# Patient Record
Sex: Male | Born: 1990 | Race: Black or African American | Hispanic: No | Marital: Single | Smoking: Current some day smoker
Health system: Southern US, Community
[De-identification: ages and names within clinical notes are randomized; demographics above are authoritative.]

## PROBLEM LIST (undated history)

## (undated) ENCOUNTER — Emergency Department (HOSPITAL_COMMUNITY): Admission: EM | Payer: Medicaid Other | Source: Home / Self Care

---

## 2010-08-16 ENCOUNTER — Emergency Department (HOSPITAL_BASED_OUTPATIENT_CLINIC_OR_DEPARTMENT_OTHER)
Admission: EM | Admit: 2010-08-16 | Discharge: 2010-08-16 | Disposition: A | Discharge status: Home / Self Care | Payer: Medicaid Other | Attending: Emergency Medicine | Admitting: Emergency Medicine

## 2010-08-16 DIAGNOSIS — M545 Low back pain, unspecified: Secondary | ICD-10-CM | POA: Insufficient documentation

## 2010-10-26 ENCOUNTER — Emergency Department (HOSPITAL_BASED_OUTPATIENT_CLINIC_OR_DEPARTMENT_OTHER)
Admission: EM | Admit: 2010-10-26 | Discharge: 2010-10-26 | Disposition: A | Discharge status: Home / Self Care | Payer: Medicaid Other | Attending: Emergency Medicine | Admitting: Emergency Medicine

## 2010-10-26 DIAGNOSIS — R369 Urethral discharge, unspecified: Secondary | ICD-10-CM | POA: Insufficient documentation

## 2010-10-26 DIAGNOSIS — N342 Other urethritis: Secondary | ICD-10-CM | POA: Insufficient documentation

## 2010-10-26 LAB — URINALYSIS, ROUTINE W REFLEX MICROSCOPIC
Bilirubin Urine: NEGATIVE
Glucose, UA: NEGATIVE mg/dL
Hgb urine dipstick: NEGATIVE
Ketones, ur: NEGATIVE mg/dL
Protein, ur: NEGATIVE mg/dL
Urobilinogen, UA: 1 mg/dL (ref 0.0–1.0)

## 2010-10-29 LAB — GC/CHLAMYDIA PROBE AMP, GENITAL
Chlamydia, DNA Probe: POSITIVE — AB
GC Probe Amp, Genital: NEGATIVE

## 2011-10-17 ENCOUNTER — Encounter (HOSPITAL_BASED_OUTPATIENT_CLINIC_OR_DEPARTMENT_OTHER): Payer: Self-pay | Admitting: *Deleted

## 2011-10-17 ENCOUNTER — Emergency Department (HOSPITAL_BASED_OUTPATIENT_CLINIC_OR_DEPARTMENT_OTHER)
Admission: EM | Admit: 2011-10-17 | Discharge: 2011-10-17 | Disposition: A | Discharge status: Home / Self Care | Payer: Medicaid Other | Attending: Emergency Medicine | Admitting: Emergency Medicine

## 2011-10-17 DIAGNOSIS — IMO0001 Reserved for inherently not codable concepts without codable children: Secondary | ICD-10-CM | POA: Insufficient documentation

## 2011-10-17 DIAGNOSIS — M7918 Myalgia, other site: Secondary | ICD-10-CM

## 2011-10-17 DIAGNOSIS — N509 Disorder of male genital organs, unspecified: Secondary | ICD-10-CM | POA: Insufficient documentation

## 2011-10-17 DIAGNOSIS — R369 Urethral discharge, unspecified: Secondary | ICD-10-CM | POA: Insufficient documentation

## 2011-10-17 DIAGNOSIS — R599 Enlarged lymph nodes, unspecified: Secondary | ICD-10-CM | POA: Insufficient documentation

## 2011-10-17 DIAGNOSIS — R300 Dysuria: Secondary | ICD-10-CM | POA: Insufficient documentation

## 2011-10-17 DIAGNOSIS — N342 Other urethritis: Secondary | ICD-10-CM

## 2011-10-17 DIAGNOSIS — M25519 Pain in unspecified shoulder: Secondary | ICD-10-CM | POA: Insufficient documentation

## 2011-10-17 LAB — URINALYSIS, ROUTINE W REFLEX MICROSCOPIC
Bilirubin Urine: NEGATIVE
Nitrite: NEGATIVE
Specific Gravity, Urine: 1.021 (ref 1.005–1.030)
Urobilinogen, UA: 1 mg/dL (ref 0.0–1.0)
pH: 7 (ref 5.0–8.0)

## 2011-10-17 LAB — URINE MICROSCOPIC-ADD ON

## 2011-10-17 MED ORDER — IBUPROFEN 800 MG PO TABS: 800.0000 mg | ORAL_TABLET | Freq: Three times a day (TID) | ORAL | Status: DC | PRN

## 2011-10-17 MED ORDER — IBUPROFEN 800 MG PO TABS
800.0000 mg | ORAL_TABLET | Freq: Once | ORAL | Status: AC
  Administered 2011-10-17: 800 mg via ORAL
  Filled 2011-10-17: qty 1

## 2011-10-17 MED ORDER — CEFTRIAXONE SODIUM 250 MG IJ SOLR
250.0000 mg | Freq: Once | INTRAMUSCULAR | Status: AC
  Administered 2011-10-17: 250 mg via INTRAMUSCULAR
  Filled 2011-10-17: qty 250

## 2011-10-17 MED ORDER — AZITHROMYCIN 250 MG PO TABS
1000.0000 mg | ORAL_TABLET | Freq: Once | ORAL | Status: AC
  Administered 2011-10-17: 1000 mg via ORAL
  Filled 2011-10-17: qty 4

## 2011-10-17 MED ORDER — AZITHROMYCIN 500 MG PO TABS: 1000.0000 mg | ORAL_TABLET | Freq: Once | ORAL | Status: AC

## 2011-10-17 MED ORDER — METRONIDAZOLE 500 MG PO TABS
2000.0000 mg | ORAL_TABLET | Freq: Once | ORAL | Status: AC
  Administered 2011-10-17: 2000 mg via ORAL
  Filled 2011-10-17: qty 4

## 2011-10-17 NOTE — ED Provider Notes (Signed)
History     CSN: [id number]  Arrival date & time 10/17/11  4098   First MD Initiated Contact with Patient 10/17/11 [phone]      Chief Complaint  Patient presents with  . SEXUALLY TRANSMITTED DISEASE    (Consider location/radiation/quality/duration/timing/severity/associated sxs/prior treatment) HPI The patient is a 21 year old male with a history of Chlamydia who presents today complaining of penile discharge over the past 4 days. This is whitish milky in character. He endorses some penile pain as well as dysuria. Patient has no history of urinary tract infections. He denies any testicular tenderness. He should says this is slightly different from his last sexual transmitted infection. He did have unprotected sex a month ago. He was told that his partner had bacterial vaginosis but he has not been tested since that time. Patient denies nausea, vomiting, abdominal pain, or fevers. He also complains of some left shoulder pain that is an 8/10. He cannot recall any injury. Patient has not taken any medications for this. This is worse with movement. He has no history of prior shoulder injury. There are no other associated or modifying factors. History reviewed. No pertinent past medical history.  History reviewed. No pertinent past surgical history.  History reviewed. No pertinent family history.  History  Substance Use Topics  . Smoking status: Current Some Day Smoker -- 0.1 packs/day for 1 years    Types: Cigarettes  . Smokeless tobacco: Never Used  . Alcohol Use: No      Review of Systems  Constitutional: Negative.   HENT: Negative.   Eyes: Negative.   Respiratory: Negative.   Cardiovascular: Negative.   Gastrointestinal: Negative.   Genitourinary: Positive for dysuria, discharge and penile pain.  Musculoskeletal:       See HPI  Skin: Negative.   Neurological: Negative.   Hematological: Negative.   Psychiatric/Behavioral: Negative.   All other systems reviewed and are  negative.    Allergies  Review of patient's allergies indicates no known allergies.  Home Medications  No current outpatient prescriptions on file.  BP 123/63  Pulse 64  Temp(Src) 98.2 F (36.8 C) (Oral)  Resp 16  SpO2 99%  Physical Exam  Nursing note and vitals reviewed. GEN: Well-developed, well-nourished male in no distress HEENT: Atraumatic, normocephalic.  EYES: PERRLA BL, no scleral icterus. NECK: Trachea midline, no meningismus CV: regular rate and rhythm. No murmurs, rubs, or gallops PULM: No respiratory distress.  No crackles, wheezes, or rales. GI: soft, non-tender. No guarding, rebound, or tenderness. + bowel sounds  GU: Bilateral inguinal lymphadenopathy noted. Patient is a circumcised male. No testicular tenderness or masses. Patient has some light amount of white discharge noted at the urethral meatus. Swab was collected. Neuro: cranial nerves 2-12 intact, no abnormalities of strength or sensation, A and O x 3 MSK: Patient moves all 4 extremities symmetrically, no deformity, edema, or injury noted Skin: No rashes petechiae, purpura, or jaundice Psych: no abnormality of mood   ED Course  Procedures (including critical care time)  Labs Reviewed  URINALYSIS, ROUTINE W REFLEX MICROSCOPIC - Abnormal; Notable for the following:    APPearance CLOUDY (*)    Leukocytes, UA MODERATE (*)    All other components within normal limits  URINE MICROSCOPIC-ADD ON  URINE CULTURE  GC/CHLAMYDIA PROBE AMP, GENITAL   No results found.   1. Urethritis   2. Musculoskeletal pain       MDM  Patient was evaluated by myself. Based on evaluation he did have urinalysis and urine culture  performed. He also had gonorrhea and Chlamydia swab. Patient was counseled on the practice of safe sex with use of condoms. Given his prior history as well as his symptoms he was treated with azithromycin, Rocephin, and Flagyl for possible gonorrhea, Chlamydia, and Trichomonas. Patient was told  that he would need to seek testing for HIV as he is at risk for this as well. Patient complained of left shoulder pain with no obvious injury and no significant physical exam findings. He was given 800 mg of ibuprofen for this.  Urinary tract infection was consistent with the urethra this but given patient's dysuria a urine culture was sent as well. Patient was discharged in good condition.        Cyndra Numbers, MD 10/17/11 [phone]

## 2011-10-17 NOTE — ED Provider Notes (Signed)
Patient called 2 hours following discharge. He reported that he had vomited. Patient was concerned about antibiotics not being functional. I called in a not a prescription for 1000 mg of azithromycin to the patient's pharmacy. Advised him to try and take this again in 24 hours. He was advised to take this on a full stomach.   Cyndra Numbers, MD 10/17/11 [phone]

## 2011-10-17 NOTE — Discharge Instructions (Signed)
You were tested for gonorrhea and chlamydia tonight.  You were treated for gonorrhea, chlamydia, and trichomonas.  Please notify all of your partners and use condoms. Musculoskeletal Pain Musculoskeletal pain is muscle and boney aches and pains. These pains can occur in any part of the body. Your caregiver may treat you without knowing the cause of the pain. They may treat you if blood or urine tests, X-rays, and other tests were normal.  CAUSES There is often not a definite cause or reason for these pains. These pains may be caused by a type of germ (virus). The discomfort may also come from overuse. Overuse includes working out too hard when your body is not fit. Boney aches also come from weather changes. Bone is sensitive to atmospheric pressure changes. HOME CARE INSTRUCTIONS   Ask when your test results will be ready. Make sure you get your test results.   Only take over-the-counter or prescription medicines for pain, discomfort, or fever as directed by your caregiver. If you were given medications for your condition, do not drive, operate machinery or power tools, or sign legal documents for 24 hours. Do not drink alcohol. Do not take sleeping pills or other medications that may interfere with treatment.   Continue all activities unless the activities cause more pain. When the pain lessens, slowly resume normal activities. Gradually increase the intensity and duration of the activities or exercise.   During periods of severe pain, bed rest may be helpful. Lay or sit in any position that is comfortable.   Putting ice on the injured area.   Put ice in a bag.   Place a towel between your skin and the bag.   Leave the ice on for 15 to 20 minutes, 3 to 4 times a day.   Follow up with your caregiver for continued problems and no reason can be found for the pain. If the pain becomes worse or does not go away, it may be necessary to repeat tests or do additional testing. Your caregiver may need  to look further for a possible cause.  SEEK IMMEDIATE MEDICAL CARE IF:  You have pain that is getting worse and is not relieved by medications.   You develop chest pain that is associated with shortness or breath, sweating, feeling sick to your stomach (nauseous), or throw up (vomit).   Your pain becomes localized to the abdomen.   You develop any new symptoms that seem different or that concern you.  MAKE SURE YOU:   Understand these instructions.   Will watch your condition.   Will get help right away if you are not doing well or get worse.  Document Released: 05/16/2005 Document Revised: 05/05/2011 Document Reviewed: 01/04/2008 [facility] Patient Information 2012 [place], [place].Urethritis, Adult Urethritis is an inflammation (soreness) of the urethra (the tube exiting from the bladder). It is often caused by germs that may be spread through sexual contact. TREATMENT  Urethritis will usually respond to antibiotics. These are medications that kill germs. Take all the medicine given to you. You may feel better in a couple days, but TAKE ALL MEDICINE or the infection may not be completely cured and may become more difficult to treat. Response can generally be expected in 7 to 10 days. You may require additional treatment after more testing. HOME CARE INSTRUCTIONS  Not have sex until the test results are known and treatment is completed.   Know that you may be asked to notify your sex partner when your final test results  are back.   Finish all medications as prescribed.   Prevent sexually transmitted infections including AIDS. Practice safe sex. Use condoms.  SEEK MEDICAL CARE IF:   Your symptoms are not improved in 2 to 3 days.   Your symptoms are getting worse.   Your develop abdominal pain.   You develop joint pain.  SEEK IMMEDIATE MEDICAL CARE IF:   You have a fever.   You develop severe pain in the belly, back or side.   You develop repeated vomiting.  TEST  RESULTS Not all test results are available during your visit. If your test results are not back during the visit, make an appointment with your caregiver to find out the results. Do not assume everything is normal if you have not heard from your caregiver or the medical facility. It is important for you to follow-up on all of your test results. Document Released: 11/09/2000 Document Revised: 05/05/2011 Document Reviewed: 06/01/2009 [facility] Patient Information 2012 [place], [place].

## 2011-10-17 NOTE — ED Notes (Signed)
Pt reports penile discharge, cream and milky in color, penile pain since a week ago Thursday. States that pain has now radiated to his back and shoulder. Pt states that he was last sexually active a month ago. States that a sexual partner did notify pt a couple of weeks ago that she had Bacterial Vaginosa, but pt did not seek treatment. Pt reports back/shoulder pain 8/10. Pt reports not taking any OTC meds for the pain. Pt reports pain on urination, as well.

## 2011-10-18 LAB — GC/CHLAMYDIA PROBE AMP, GENITAL
Chlamydia, DNA Probe: NEGATIVE
GC Probe Amp, Genital: POSITIVE — AB

## 2011-10-18 LAB — URINE CULTURE
Colony Count: NO GROWTH
Culture: NO GROWTH

## 2011-10-19 ENCOUNTER — Encounter (HOSPITAL_BASED_OUTPATIENT_CLINIC_OR_DEPARTMENT_OTHER): Payer: Self-pay

## 2011-10-19 ENCOUNTER — Emergency Department (HOSPITAL_BASED_OUTPATIENT_CLINIC_OR_DEPARTMENT_OTHER)
Admission: EM | Admit: 2011-10-19 | Discharge: 2011-10-19 | Disposition: A | Discharge status: Home / Self Care | Payer: Medicaid Other | Attending: Emergency Medicine | Admitting: Emergency Medicine

## 2011-10-19 DIAGNOSIS — IMO0001 Reserved for inherently not codable concepts without codable children: Secondary | ICD-10-CM | POA: Insufficient documentation

## 2011-10-19 DIAGNOSIS — J3489 Other specified disorders of nose and nasal sinuses: Secondary | ICD-10-CM | POA: Insufficient documentation

## 2011-10-19 DIAGNOSIS — R509 Fever, unspecified: Secondary | ICD-10-CM | POA: Insufficient documentation

## 2011-10-19 DIAGNOSIS — B349 Viral infection, unspecified: Secondary | ICD-10-CM

## 2011-10-19 MED ORDER — DESLORATADINE 5 MG PO TABS: 5.0000 mg | ORAL_TABLET | Freq: Every day | ORAL | Status: DC

## 2011-10-19 MED ORDER — IBUPROFEN 600 MG PO TABS: 600.0000 mg | ORAL_TABLET | Freq: Four times a day (QID) | ORAL | Status: AC | PRN

## 2011-10-19 NOTE — ED Notes (Signed)
C/o fever, body aches since 630pm

## 2011-10-19 NOTE — ED Notes (Signed)
+   gonorrhea Patient treated with Rocephin  And Zithromax [facility]HS letter faxed.

## 2011-10-19 NOTE — ED Provider Notes (Signed)
History     CSN: [id number]  Arrival date & time 10/19/11  2126   First MD Initiated Contact with Patient 10/19/11 2301      Chief Complaint  Patient presents with  . Fever  . Generalized Body Aches    (Consider location/radiation/quality/duration/timing/severity/associated sxs/prior treatment) Patient is a 21 y.o. male presenting with fever. The history is provided by the patient. No language interpreter was used.  Fever Primary symptoms of the febrile illness include fever and myalgias. Primary symptoms do not include fatigue, visual change, headaches, cough, wheezing, shortness of breath, abdominal pain, nausea, vomiting, diarrhea, dysuria or rash. The current episode started today. This is a new problem. The problem has not changed since onset. Myalgias began today. The myalgias have been unchanged since their onset. The myalgias are generalized. The myalgias are aching. The discomfort from the myalgias is mild. The myalgias are not associated with weakness, tenderness or swelling. Risk factors: sick contact.  The onset of the illness is associated with recent antibiotic use. Risk factors: none.   History reviewed. No pertinent past medical history.  History reviewed. No pertinent past surgical history.  No family history on file.  History  Substance Use Topics  . Smoking status: Current Some Day Smoker -- 0.1 packs/day for 1 years    Types: Cigarettes  . Smokeless tobacco: Never Used  . Alcohol Use: No      Review of Systems  Constitutional: Positive for fever. Negative for fatigue.  HENT: Positive for congestion, rhinorrhea and sneezing. Negative for sore throat, trouble swallowing, neck pain, neck stiffness and voice change.   Respiratory: Negative for cough, shortness of breath and wheezing.   Cardiovascular: Negative for chest pain and leg swelling.  Gastrointestinal: Negative for nausea, vomiting, abdominal pain and diarrhea.  Genitourinary: Negative for  dysuria.  Musculoskeletal: Positive for myalgias.  Skin: Negative for rash.  Neurological: Negative for weakness and headaches.    Allergies  Review of patient's allergies indicates no known allergies.  Home Medications   Current Outpatient Rx  Name Route Sig Dispense Refill  . AZITHROMYCIN 500 MG PO TABS Oral Take 2 tablets (1,000 mg total) by mouth once. 2 tablet 0  . IBUPROFEN 800 MG PO TABS Oral Take 800 mg by mouth every 8 (eight) hours as needed. Patient used this medication for his headache.      BP 104/62  Pulse 77  Temp(Src) 98.2 F (36.8 C) (Oral)  Resp 16  Ht 6' (1.829 m)  Wt 149 lb (67.586 kg)  BMI 20.21 kg/m2  SpO2 100%  Physical Exam  Constitutional: He is oriented to person, place, and time. He appears well-developed and well-nourished.  HENT:  Head: Normocephalic and atraumatic.  Right Ear: Tympanic membrane is not injected.  Left Ear: Tympanic membrane is not injected.  Mouth/Throat: Oropharynx is clear and moist.  Eyes: Conjunctivae are normal. Pupils are equal, round, and reactive to light.  Neck: Normal range of motion. Neck supple.  Cardiovascular: Normal rate and regular rhythm.   Pulmonary/Chest: Effort normal and breath sounds normal. He has no wheezes. He has no rales.  Abdominal: Soft. Bowel sounds are normal. There is no tenderness. There is no rebound and no guarding.  Musculoskeletal: Normal range of motion.  Neurological: He is alert and oriented to person, place, and time.  Skin: Skin is warm and dry.  Psychiatric: He has a normal mood and affect.    ED Course  Procedures (including critical care time)  Labs Reviewed - No  data to display No results found.   No diagnosis found.    MDM  Return for fevers, > 101.4, cough productive change in voice or difficulty swallowing.  Mother with same.  Patient verbalizes understanding and agrees to follow up       Mellina Benison K Milee Qualls-Rasch, MD 10/19/11 2307

## 2011-10-21 NOTE — ED Notes (Signed)
Called and notified patient of +result and treatment. Verified x2.

## 2012-03-13 ENCOUNTER — Emergency Department (HOSPITAL_BASED_OUTPATIENT_CLINIC_OR_DEPARTMENT_OTHER)
Admission: EM | Admit: 2012-03-13 | Discharge: 2012-03-13 | Disposition: A | Discharge status: Home / Self Care | Payer: Medicaid Other | Attending: Emergency Medicine | Admitting: Emergency Medicine

## 2012-03-13 ENCOUNTER — Encounter (HOSPITAL_BASED_OUTPATIENT_CLINIC_OR_DEPARTMENT_OTHER): Payer: Self-pay | Admitting: *Deleted

## 2012-03-13 DIAGNOSIS — Z202 Contact with and (suspected) exposure to infections with a predominantly sexual mode of transmission: Secondary | ICD-10-CM | POA: Insufficient documentation

## 2012-03-13 DIAGNOSIS — F17200 Nicotine dependence, unspecified, uncomplicated: Secondary | ICD-10-CM | POA: Insufficient documentation

## 2012-03-13 MED ORDER — LIDOCAINE HCL (PF) 1 % IJ SOLN
INTRAMUSCULAR | Status: AC
  Administered 2012-03-13: 5 mL
  Filled 2012-03-13: qty 5

## 2012-03-13 MED ORDER — AZITHROMYCIN 250 MG PO TABS
1000.0000 mg | ORAL_TABLET | Freq: Once | ORAL | Status: AC
  Administered 2012-03-13: 1000 mg via ORAL
  Filled 2012-03-13: qty 4

## 2012-03-13 MED ORDER — AZITHROMYCIN 1 G PO PACK: 1.0000 g | PACK | Freq: Once | ORAL | Status: DC

## 2012-03-13 MED ORDER — CEFTRIAXONE SODIUM 250 MG IJ SOLR
250.0000 mg | Freq: Once | INTRAMUSCULAR | Status: AC
  Administered 2012-03-13: 250 mg via INTRAMUSCULAR
  Filled 2012-03-13: qty 250

## 2012-03-13 NOTE — ED Notes (Signed)
Yellow discharge from his penis x 2 days.

## 2012-03-13 NOTE — ED Provider Notes (Signed)
Medical screening examination/treatment/procedure(s) were performed by non-physician practitioner and as supervising physician I was immediately available for consultation/collaboration.   Hilari Wethington W Warrene Kapfer, MD 03/13/12 1532 

## 2012-03-13 NOTE — ED Provider Notes (Signed)
History     CSN: [id number]  Arrival date & time 03/13/12  1421   First MD Initiated Contact with Patient 03/13/12 1436      Chief Complaint  Patient presents with  . Penile Discharge    (Consider location/radiation/quality/duration/timing/severity/associated sxs/prior treatment) HPI Comments: Patient presents with 2 days of penile discharge and dysuria. Patient states that he had unprotected sex with a woman last Saturday. He also states that he is not longer in contact with her. Denies fever or chills. Denies frequency, urgency, or hematuria. Denies testicular swelling.  The history is provided by the patient. No language interpreter was used.    History reviewed. No pertinent past medical history.  History reviewed. No pertinent past surgical history.  No family history on file.  History  Substance Use Topics  . Smoking status: Current Some Day Smoker -- 0.1 packs/day for 1 years    Types: Cigarettes  . Smokeless tobacco: Never Used  . Alcohol Use: No      Review of Systems  Constitutional: Negative for fever and chills.  Genitourinary: Positive for dysuria and discharge. Negative for urgency, frequency, hematuria and scrotal swelling.    Allergies  Review of patient's allergies indicates no known allergies.  Home Medications   Current Outpatient Rx  Name Route Sig Dispense Refill  . DESLORATADINE 5 MG PO TABS Oral Take 1 tablet (5 mg total) by mouth daily. 7 tablet 0    BP 109/50  Pulse 86  Temp 98.4 F (36.9 C) (Oral)  Resp 20  SpO2 100%  Physical Exam  Nursing note and vitals reviewed. Constitutional: He appears well-developed and well-nourished. No distress.  HENT:  Head: Normocephalic and atraumatic.  Mouth/Throat: Oropharynx is clear and moist.  Eyes: Conjunctivae normal and EOM are normal. No scleral icterus.  Neck: Normal range of motion. Neck supple.  Cardiovascular: Normal rate, regular rhythm and normal heart sounds.   Pulmonary/Chest:  Effort normal and breath sounds normal.  Abdominal: Soft. Bowel sounds are normal. There is no tenderness.  Genitourinary: Testes normal and penis normal.    Right testis shows no mass, no swelling and no tenderness. Left testis shows no mass, no swelling and no tenderness. Circumcised.    ED Course  Procedures (including critical care time)   Labs Reviewed  GC/CHLAMYDIA PROBE AMP, GENITAL   No results found.   1. Exposure to STD       MDM  Patient presented with 2 day history of penile discharge. GC/Chlamydia swab collected. Patient treated for gc/chlamydia with IM Rocephin and Azithromycin. Patient informed that he will be called if swab positive, necessity to inform partner, and condom use. Return precautions given.         Pixie Casino, PA-C 03/13/12 [phone]

## 2012-03-14 LAB — GC/CHLAMYDIA PROBE AMP, GENITAL: Chlamydia, DNA Probe: POSITIVE — AB

## 2012-03-15 ENCOUNTER — Telehealth (HOSPITAL_COMMUNITY): Payer: Self-pay | Admitting: *Deleted

## 2012-03-15 NOTE — ED Notes (Signed)
+   Chlamydia + Gonorrhea Patient treated with rocephin and zithromax-[facility] letter faxed.

## 2012-03-15 NOTE — ED Notes (Signed)
Patient informed of positive results after id'd x 2 and informed of need to notify partner to be treated. 

## 2012-12-23 ENCOUNTER — Encounter (HOSPITAL_BASED_OUTPATIENT_CLINIC_OR_DEPARTMENT_OTHER): Payer: Self-pay | Admitting: *Deleted

## 2012-12-23 ENCOUNTER — Emergency Department (HOSPITAL_BASED_OUTPATIENT_CLINIC_OR_DEPARTMENT_OTHER)
Admission: EM | Admit: 2012-12-23 | Discharge: 2012-12-23 | Disposition: A | Discharge status: Home / Self Care | Payer: Self-pay | Attending: Emergency Medicine | Admitting: Emergency Medicine

## 2012-12-23 DIAGNOSIS — Z8619 Personal history of other infectious and parasitic diseases: Secondary | ICD-10-CM | POA: Insufficient documentation

## 2012-12-23 DIAGNOSIS — N342 Other urethritis: Secondary | ICD-10-CM | POA: Insufficient documentation

## 2012-12-23 DIAGNOSIS — R300 Dysuria: Secondary | ICD-10-CM | POA: Insufficient documentation

## 2012-12-23 DIAGNOSIS — R369 Urethral discharge, unspecified: Secondary | ICD-10-CM | POA: Insufficient documentation

## 2012-12-23 DIAGNOSIS — F17200 Nicotine dependence, unspecified, uncomplicated: Secondary | ICD-10-CM | POA: Insufficient documentation

## 2012-12-23 LAB — URINALYSIS, ROUTINE W REFLEX MICROSCOPIC
Glucose, UA: NEGATIVE mg/dL
Nitrite: NEGATIVE
Protein, ur: NEGATIVE mg/dL
pH: 6.5 (ref 5.0–8.0)

## 2012-12-23 LAB — URINE MICROSCOPIC-ADD ON

## 2012-12-23 MED ORDER — CEFTRIAXONE SODIUM 250 MG IJ SOLR
250.0000 mg | Freq: Once | INTRAMUSCULAR | Status: AC
  Administered 2012-12-23: 250 mg via INTRAMUSCULAR
  Filled 2012-12-23: qty 250

## 2012-12-23 MED ORDER — LIDOCAINE HCL (PF) 1 % IJ SOLN
INTRAMUSCULAR | Status: AC
  Administered 2012-12-23: 0.9 mL
  Filled 2012-12-23: qty 5

## 2012-12-23 MED ORDER — AZITHROMYCIN 250 MG PO TABS
1000.0000 mg | ORAL_TABLET | Freq: Once | ORAL | Status: AC
  Administered 2012-12-23: 1000 mg via ORAL
  Filled 2012-12-23: qty 4

## 2012-12-23 MED ORDER — CIPROFLOXACIN HCL 500 MG PO TABS: 500.0000 mg | ORAL_TABLET | Freq: Two times a day (BID) | ORAL | Status: DC

## 2012-12-23 NOTE — ED Notes (Signed)
D/c with rx x 1 for cipro- no s/s of allergic reaction to rocephin

## 2012-12-23 NOTE — ED Provider Notes (Signed)
CSN: [id number]     Arrival date & time 12/23/12  2142 History     First MD Initiated Contact with Patient 12/23/12 2221     Chief Complaint  Patient presents with  . Penile Discharge   (Consider location/radiation/quality/duration/timing/severity/associated sxs/prior Treatment) HPI  22 year old male presents for evaluations of penile discharge. Patient noticed burning when urinating and having penile discharge which started today. Discharge is similar to prior episode when he was diagnosed with Chlamydia 3 years ago. Otherwise patient denies fever, back pain, abdominal pain, penile pain, scrotal pain and swelling, or rash. He is sexually active with one partner in the past 6 months but does not use protection every single time.  History reviewed. No pertinent past medical history. History reviewed. No pertinent past surgical history. History reviewed. No pertinent family history. History  Substance Use Topics  . Smoking status: Current Some Day Smoker -- 0.10 packs/day for 1 years    Types: Cigarettes  . Smokeless tobacco: Never Used  . Alcohol Use: No    Review of Systems  Constitutional: Negative for fever and chills.  Genitourinary: Positive for dysuria and discharge. Negative for flank pain, penile pain and testicular pain.  Skin: Negative for rash.  Neurological: Negative for headaches.  All other systems reviewed and are negative.    Allergies  Review of patient's allergies indicates no known allergies.  Home Medications   Current Outpatient Rx  Name  Route  Sig  Dispense  Refill  . EXPIRED: desloratadine (CLARINEX) 5 MG tablet   Oral   Take 1 tablet (5 mg total) by mouth daily.   7 tablet   0    BP 117/53  Pulse 62  Temp(Src) 97.8 F (36.6 C) (Oral)  Resp 18  Ht 6' (1.829 m)  Wt 150 lb (68.04 kg)  BMI 20.34 kg/m2  SpO2 100% Physical Exam  Nursing note and vitals reviewed. Constitutional: He appears well-developed and well-nourished.  HENT:  Head:  Atraumatic.  Eyes: Conjunctivae are normal.  Neck: Neck supple.  Abdominal: Soft. There is no tenderness.  Genitourinary:  No cva tenderness  No testicular/scrotal tenderness or rash.    No hernia  Neurological: He is alert.  Skin: Skin is warm. No rash noted.  Psychiatric: He has a normal mood and affect.    ED Course   Procedures (including critical care time)  10:39 PM Patient presents with penile discharge. Patient is at risk for gonorrhea/chlamydia infection. GC and chlamydia swab obtained. Will give Rocephin and Zithromax. Many bacteria noted in urine and patient does complain of dysuria, we'll treat for urethritis with Cipro. Recommend refrain from sexual activity until symptoms resolve. Patient aware to notify partner if tested positive. Safe sex practices discussed.  Labs Reviewed  URINALYSIS, ROUTINE W REFLEX MICROSCOPIC - Abnormal; Notable for the following:    APPearance CLOUDY (*)    Hgb urine dipstick TRACE (*)    Leukocytes, UA LARGE (*)    All other components within normal limits  URINE MICROSCOPIC-ADD ON - Abnormal; Notable for the following:    Squamous Epithelial / LPF FEW (*)    Bacteria, UA FEW (*)    All other components within normal limits  URINE CULTURE   No results found. 1. Penile discharge   2. Urethritis     MDM  BP 117/53  Pulse 62  Temp(Src) 97.8 F (36.6 C) (Oral)  Resp 18  Ht 6' (1.829 m)  Wt 150 lb (68.04 kg)  BMI 20.34 kg/m2  SpO2  100%   Fayrene Helper, PA-C 12/23/12 2307

## 2012-12-23 NOTE — ED Notes (Signed)
Pt reports penile discharge x 1 day.

## 2012-12-23 NOTE — ED Provider Notes (Signed)
Medical screening examination/treatment/procedure(s) were performed by non-physician practitioner and as supervising physician I was immediately available for consultation/collaboration.   Likisha Alles, MD 12/23/12 2340 

## 2012-12-23 NOTE — ED Notes (Signed)
PA at bedside.

## 2012-12-25 LAB — URINE CULTURE
Colony Count: NO GROWTH
Culture: NO GROWTH

## 2012-12-26 LAB — GC/CHLAMYDIA PROBE AMP
CT Probe RNA: NEGATIVE
GC Probe RNA: UNDETERMINED

## 2013-02-02 ENCOUNTER — Encounter (HOSPITAL_BASED_OUTPATIENT_CLINIC_OR_DEPARTMENT_OTHER): Payer: Self-pay | Admitting: *Deleted

## 2013-02-02 ENCOUNTER — Emergency Department (HOSPITAL_BASED_OUTPATIENT_CLINIC_OR_DEPARTMENT_OTHER)
Admission: EM | Admit: 2013-02-02 | Discharge: 2013-02-02 | Disposition: A | Discharge status: Home / Self Care | Payer: Medicaid Other | Attending: Emergency Medicine | Admitting: Emergency Medicine

## 2013-02-02 DIAGNOSIS — F17200 Nicotine dependence, unspecified, uncomplicated: Secondary | ICD-10-CM | POA: Insufficient documentation

## 2013-02-02 DIAGNOSIS — Z792 Long term (current) use of antibiotics: Secondary | ICD-10-CM | POA: Insufficient documentation

## 2013-02-02 DIAGNOSIS — Z79899 Other long term (current) drug therapy: Secondary | ICD-10-CM | POA: Insufficient documentation

## 2013-02-02 DIAGNOSIS — B86 Scabies: Secondary | ICD-10-CM | POA: Insufficient documentation

## 2013-02-02 MED ORDER — PERMETHRIN 5 % EX CREA: TOPICAL_CREAM | CUTANEOUS | Status: DC

## 2013-02-02 NOTE — ED Provider Notes (Signed)
CSN: [id number]     Arrival date & time 02/02/13  0013 History   First MD Initiated Contact with Patient 02/02/13 0026     Chief Complaint  Patient presents with  . Rash   (Consider location/radiation/quality/duration/timing/severity/associated sxs/prior Treatment) Patient is a 22 y.o. male presenting with rash. The history is provided by the patient. No language interpreter was used.  Rash Location:  Full body Quality: itchiness   Quality comment:  Burrowing Severity:  Moderate Onset quality:  Gradual Timing:  Constant Progression:  Spreading Chronicity:  New Context: not plant contact   Relieved by:  Nothing Worsened by:  Nothing tried Ineffective treatments:  None tried Associated symptoms: no abdominal pain     History reviewed. No pertinent past medical history. History reviewed. No pertinent past surgical history. History reviewed. No pertinent family history. History  Substance Use Topics  . Smoking status: Current Some Day Smoker -- 0.10 packs/day for 1 years    Types: Cigarettes  . Smokeless tobacco: Never Used  . Alcohol Use: No    Review of Systems  Gastrointestinal: Negative for abdominal pain.  Skin: Positive for rash.  All other systems reviewed and are negative.    Allergies  Review of patient's allergies indicates no known allergies.  Home Medications   Current Outpatient Rx  Name  Route  Sig  Dispense  Refill  . ciprofloxacin (CIPRO) 500 MG tablet   Oral   Take 1 tablet (500 mg total) by mouth every 12 (twelve) hours.   10 tablet   0   . EXPIRED: desloratadine (CLARINEX) 5 MG tablet   Oral   Take 1 tablet (5 mg total) by mouth daily.   7 tablet   0   . permethrin (ELIMITE) 5 % cream      Use as directed   60 g   0    BP 113/60  Pulse 70  Temp(Src) 98.6 F (37 C) (Oral)  Resp 18  SpO2 100% Physical Exam  Constitutional: He is oriented to person, place, and time. He appears well-developed and well-nourished. No distress.   HENT:  Head: Normocephalic and atraumatic.  Mouth/Throat: Oropharynx is clear and moist.  Eyes: Conjunctivae are normal. Pupils are equal, round, and reactive to light.  Neck: Normal range of motion. Neck supple.  Cardiovascular: Normal rate and regular rhythm.   Pulmonary/Chest: Breath sounds normal. He has no wheezes. He has no rales.  Abdominal: Soft. Bowel sounds are normal. There is no tenderness. There is no rebound and no guarding.  Musculoskeletal: Normal range of motion.  Neurological: He is alert and oriented to person, place, and time.  Skin: Skin is warm and dry. Rash noted.  Papular with burrows and tracks on trunk arms and between fingers  Psychiatric: He has a normal mood and affect.    ED Course  Procedures (including critical care time) Labs Review Labs Reviewed - No data to display Imaging Review No results found.  MDM   1. Scabies    Will treat with permethrin    Faaris Arizpe K Laya Letendre-Rasch, MD 02/02/13 [phone]

## 2013-02-02 NOTE — ED Notes (Signed)
Pt with rash to bilateral arms and body x 1 week, rash is getting worse,

## 2013-02-10 ENCOUNTER — Encounter (HOSPITAL_BASED_OUTPATIENT_CLINIC_OR_DEPARTMENT_OTHER): Payer: Self-pay | Admitting: *Deleted

## 2013-02-10 ENCOUNTER — Emergency Department (HOSPITAL_BASED_OUTPATIENT_CLINIC_OR_DEPARTMENT_OTHER)
Admission: EM | Admit: 2013-02-10 | Discharge: 2013-02-10 | Disposition: A | Discharge status: Home / Self Care | Payer: Medicaid Other | Attending: Emergency Medicine | Admitting: Emergency Medicine

## 2013-02-10 DIAGNOSIS — N342 Other urethritis: Secondary | ICD-10-CM | POA: Insufficient documentation

## 2013-02-10 DIAGNOSIS — F17200 Nicotine dependence, unspecified, uncomplicated: Secondary | ICD-10-CM | POA: Insufficient documentation

## 2013-02-10 MED ORDER — CEFTRIAXONE SODIUM 250 MG IJ SOLR
250.0000 mg | Freq: Once | INTRAMUSCULAR | Status: AC
  Administered 2013-02-10: 250 mg via INTRAMUSCULAR
  Filled 2013-02-10: qty 250

## 2013-02-10 MED ORDER — LIDOCAINE HCL (PF) 1 % IJ SOLN
INTRAMUSCULAR | Status: AC
  Administered 2013-02-10: 2.1 mL
  Filled 2013-02-10: qty 5

## 2013-02-10 MED ORDER — AZITHROMYCIN 250 MG PO TABS
1000.0000 mg | ORAL_TABLET | Freq: Once | ORAL | Status: AC
  Administered 2013-02-10: 1000 mg via ORAL
  Filled 2013-02-10: qty 4

## 2013-02-10 NOTE — ED Notes (Signed)
No s/sx of allergic reaction noted.  Patient advised to remain abstinent until symptoms resolve, use protection during sexual activity, and to seek further treatment if symptoms persist.

## 2013-02-10 NOTE — ED Provider Notes (Signed)
CSN: [id number]     Arrival date & time 02/10/13  1121 History   First MD Initiated Contact with Patient 02/10/13 1143     Chief Complaint  Patient presents with  . Penile Discharge   (Consider location/radiation/quality/duration/timing/severity/associated sxs/prior Treatment) HPI Comments: Patient presents with penile discharge. He states he noticed it this morning. He has a little bit of burning on urination. He has a history of Chlamydia in the past but no other sexual transmitted diseases. He denies any sores on his penis. He denies any abdominal pain or vomiting. He denies he fevers or chills.  Patient is a 22 y.o. male presenting with penile discharge.  Penile Discharge Pertinent negatives include no chest pain, no abdominal pain, no headaches and no shortness of breath.    History reviewed. No pertinent past medical history. History reviewed. No pertinent past surgical history. History reviewed. No pertinent family history. History  Substance Use Topics  . Smoking status: Current Some Day Smoker -- 0.10 packs/day for 1 years    Types: Cigars  . Smokeless tobacco: Never Used  . Alcohol Use: No    Review of Systems  Constitutional: Negative for fever, chills, diaphoresis and fatigue.  HENT: Negative for congestion, rhinorrhea and sneezing.   Eyes: Negative.   Respiratory: Negative for cough, chest tightness and shortness of breath.   Cardiovascular: Negative for chest pain and leg swelling.  Gastrointestinal: Negative for nausea, vomiting, abdominal pain, diarrhea and blood in stool.  Genitourinary: Positive for discharge. Negative for frequency, hematuria, flank pain and difficulty urinating.  Musculoskeletal: Negative for back pain and arthralgias.  Skin: Negative for rash.  Neurological: Negative for dizziness, speech difficulty, weakness, numbness and headaches.    Allergies  Review of patient's allergies indicates no known allergies.  Home Medications   Current  Outpatient Rx  Name  Route  Sig  Dispense  Refill  . ciprofloxacin (CIPRO) 500 MG tablet   Oral   Take 1 tablet (500 mg total) by mouth every 12 (twelve) hours.   10 tablet   0   . EXPIRED: desloratadine (CLARINEX) 5 MG tablet   Oral   Take 1 tablet (5 mg total) by mouth daily.   7 tablet   0   . permethrin (ELIMITE) 5 % cream      Use as directed   60 g   0    BP 107/67  Pulse 62  Temp(Src) 97.9 F (36.6 C) (Oral)  Resp 16  SpO2 100% Physical Exam  Constitutional: He is oriented to person, place, and time. He appears well-developed and well-nourished.  Cardiovascular: Normal rate.   Pulmonary/Chest: Effort normal.  Abdominal: Soft. There is no tenderness.  Genitourinary:  Patient is a normal-appearing circumcised penis. There is some white discharge at the tip of the penis. There is no ulcers or rashes noted to the genital area. There is no testicular tenderness.  Neurological: He is alert and oriented to person, place, and time.  Skin: Skin is warm and dry.    ED Course  Procedures (including critical care time) Labs Review Labs Reviewed  GC/CHLAMYDIA PROBE AMP  RPR  HIV ANTIBODY (ROUTINE TESTING)   Imaging Review No results found.  MDM   1. Urethritis    Patient was treated with Rocephin and Zithromax. STD panel was sent.    Rolan Bucco, MD 02/10/13 1257

## 2013-02-10 NOTE — ED Notes (Signed)
Painful urination & yellowish discharge since this morning

## 2013-02-13 LAB — GC/CHLAMYDIA PROBE AMP: GC Probe RNA: POSITIVE — AB

## 2014-11-12 ENCOUNTER — Encounter (HOSPITAL_BASED_OUTPATIENT_CLINIC_OR_DEPARTMENT_OTHER): Payer: Self-pay | Admitting: *Deleted

## 2014-11-12 ENCOUNTER — Emergency Department (HOSPITAL_BASED_OUTPATIENT_CLINIC_OR_DEPARTMENT_OTHER)
Admission: EM | Admit: 2014-11-12 | Discharge: 2014-11-12 | Disposition: A | Discharge status: Home / Self Care | Payer: Medicaid Other | Attending: Emergency Medicine | Admitting: Emergency Medicine

## 2014-11-12 DIAGNOSIS — Z720 Tobacco use: Secondary | ICD-10-CM | POA: Insufficient documentation

## 2014-11-12 DIAGNOSIS — Z79899 Other long term (current) drug therapy: Secondary | ICD-10-CM | POA: Insufficient documentation

## 2014-11-12 DIAGNOSIS — M25461 Effusion, right knee: Secondary | ICD-10-CM

## 2014-11-12 NOTE — ED Provider Notes (Signed)
CSN: [id number]     Arrival date & time 11/12/14  1720 History   First MD Initiated Contact with Patient 11/12/14 1807     Chief Complaint  Patient presents with  . Knee Pain     (Consider location/radiation/quality/duration/timing/severity/associated sxs/prior Treatment) HPI Comments: 24 year old male complaining of right knee swelling 3 days. States 4 days ago he was at the gym, and the next morning when he woke up, he noticed that his knee was swollen, and has been gradually increasing since. Initially on the first day of swelling he had some pain, however no longer has significant pain unless he bends his knee a certain way. He is not sure if he injured his knee at the gym, states he felt something in his bones may be pulled when he bent down. No aggravating or alleviating factors. Denies numbness or tingling. No prior injury to his right knee.  Patient is a 24 y.o. male presenting with knee pain. The history is provided by the patient.  Knee Pain   History reviewed. No pertinent past medical history. History reviewed. No pertinent past surgical history. History reviewed. No pertinent family history. History  Substance Use Topics  . Smoking status: Current Some Day Smoker -- 0.10 packs/day for 1 years    Types: Cigars  . Smokeless tobacco: Never Used  . Alcohol Use: No    Review of Systems  Constitutional: Negative.   HENT: Negative.   Musculoskeletal:       + R knee swelling/pain.  Skin: Negative for color change.  Neurological: Negative for numbness.      Allergies  Review of patient's allergies indicates no known allergies.  Home Medications   Prior to Admission medications   Medication Sig Start Date End Date Taking? Authorizing Provider  desloratadine (CLARINEX) 5 MG tablet Take 1 tablet (5 mg total) by mouth daily. 10/19/11 10/18/12  April Palumbo, MD   BP 104/57 mmHg  Pulse 56  Temp(Src) 98.3 F (36.8 C) (Oral)  Resp 16  Ht 6' (1.829 m)  Wt 149 lb  (67.586 kg)  BMI 20.20 kg/m2  SpO2 100% Physical Exam  Constitutional: He is oriented to person, place, and time. He appears well-developed and well-nourished. No distress.  HENT:  Head: Normocephalic and atraumatic.  Eyes: Conjunctivae and EOM are normal.  Neck: Normal range of motion. Neck supple.  Cardiovascular: Normal rate, regular rhythm and normal heart sounds.   +2 PT pulse on R.  Pulmonary/Chest: Effort normal and breath sounds normal.  Musculoskeletal: Normal range of motion.  Mild swelling to medial aspect of R knee with mild tenderness. Palpable click with flexion and extension of knee medially. No ligamentous laxity. No bony tenderness. FROM.  Neurological: He is alert and oriented to person, place, and time.  Skin: Skin is warm and dry.  Psychiatric: He has a normal mood and affect. His behavior is normal.  Nursing note and vitals reviewed.   ED Course  Procedures (including critical care time) Labs Review Labs Reviewed - No data to display  Imaging Review No results found.   EKG Interpretation None      MDM   Final diagnoses:  Knee effusion, right   Neurovascularly intact. No bony tenderness. No obvious injury. No erythema or warmth concerning for infection. I do not feel imaging is necessary at this time. Knee sleeve applied. Advised rice and NSAIDs. Follow-up with orthopedics in 5-7 days if no improvement. Possible meniscal injury. Stable for discharge. Return precautions given. Patient states understanding of  treatment care plan and is agreeable.  Kathrynn Speed, PA-C 11/12/14 1827  Jerelyn Scott, MD 11/12/14 [phone]

## 2014-11-12 NOTE — Discharge Instructions (Signed)
Ice and elevate your knee. Take ibuprofen or naproxen for inflammation.  Knee Effusion The medical term for having fluid in your knee is effusion. This is often due to an internal derangement of the knee. This means something is wrong inside the knee. Some of the causes of fluid in the knee may be torn cartilage, a torn ligament, or bleeding into the joint from an injury. Your knee is likely more difficult to bend and move. This is often because there is increased pain and pressure in the joint. The time it takes for recovery from a knee effusion depends on different factors, including:   Type of injury.  Your age.  Physical and medical conditions.  Rehabilitation Strategies. How long you will be away from your normal activities will depend on what kind of knee problem you have and how much damage is present. Your knee has two types of cartilage. Articular cartilage covers the bone ends and lets your knee bend and move smoothly. Two menisci, thick pads of cartilage that form a rim inside the joint, help absorb shock and stabilize your knee. Ligaments bind the bones together and support your knee joint. Muscles move the joint, help support your knee, and take stress off the joint itself. CAUSES  Often an effusion in the knee is caused by an injury to one of the menisci. This is often a tear in the cartilage. Recovery after a meniscus injury depends on how much meniscus is damaged and whether you have damaged other knee tissue. Small tears may heal on their own with conservative treatment. Conservative means rest, limited weight bearing activity and muscle strengthening exercises. Your recovery may take up to 6 weeks.  TREATMENT  Larger tears may require surgery. Meniscus injuries may be treated during arthroscopy. Arthroscopy is a procedure in which your surgeon uses a small telescope like instrument to look in your knee. Your caregiver can make a more accurate diagnosis (learning what is wrong) by  performing an arthroscopic procedure. If your injury is on the inner margin of the meniscus, your surgeon may trim the meniscus back to a smooth rim. In other cases your surgeon will try to repair a damaged meniscus with stitches (sutures). This may make rehabilitation take longer, but may provide better long term result by helping your knee keep its shock absorption capabilities. Ligaments which are completely torn usually require surgery for repair. HOME CARE INSTRUCTIONS  Use crutches as instructed.  If a brace is applied, use as directed.  Once you are home, an ice pack applied to your swollen knee may help with discomfort and help decrease swelling.  Keep your knee raised (elevated) when you are not up and around or on crutches.  Only take over-the-counter or prescription medicines for pain, discomfort, or fever as directed by your caregiver.  Your caregivers will help with instructions for rehabilitation of your knee. This often includes strengthening exercises.  You may resume a normal diet and activities as directed. SEEK MEDICAL CARE IF:   There is increased swelling in your knee.  You notice redness, swelling, or increasing pain in your knee.  An unexplained oral temperature above 102 F (38.9 C) develops. SEEK IMMEDIATE MEDICAL CARE IF:   You develop a rash.  You have difficulty breathing.  You have any allergic reactions from medications you may have been given.  There is severe pain with any motion of the knee. MAKE SURE YOU:   Understand these instructions.  Will watch your condition.  Will get  help right away if you are not doing well or get worse. Document Released: 08/06/2003 Document Revised: 08/08/2011 Document Reviewed: 10/10/2007 [facility] Patient Information 2015 [place], [place]. This information is not intended to replace advice given to you by your health care provider. Make sure you discuss any questions you have with your health care  provider. RICE: Routine Care for Injuries The routine care of many injuries includes Rest, Ice, Compression, and Elevation (RICE). HOME CARE INSTRUCTIONS  Rest is needed to allow your body to heal. Routine activities can usually be resumed when comfortable. Injured tendons and bones can take up to 6 weeks to heal. Tendons are the cord-like structures that attach muscle to bone.  Ice following an injury helps keep the swelling down and reduces pain.  Put ice in a plastic bag.  Place a towel between your skin and the bag.  Leave the ice on for 15-20 minutes, 3-4 times a day, or as directed by your health care provider. Do this while awake, for the first 24 to 48 hours. After that, continue as directed by your caregiver.  Compression helps keep swelling down. It also gives support and helps with discomfort. If an elastic bandage has been applied, it should be removed and reapplied every 3 to 4 hours. It should not be applied tightly, but firmly enough to keep swelling down. Watch fingers or toes for swelling, bluish discoloration, coldness, numbness, or excessive pain. If any of these problems occur, remove the bandage and reapply loosely. Contact your caregiver if these problems continue.  Elevation helps reduce swelling and decreases pain. With extremities, such as the arms, hands, legs, and feet, the injured area should be placed near or above the level of the heart, if possible. SEEK IMMEDIATE MEDICAL CARE IF:  You have persistent pain and swelling.  You develop redness, numbness, or unexpected weakness.  Your symptoms are getting worse rather than improving after several days. These symptoms may indicate that further evaluation or further X-rays are needed. Sometimes, X-rays may not show a small broken bone (fracture) until 1 week or 10 days later. Make a follow-up appointment with your caregiver. Ask when your X-ray results will be ready. Make sure you get your X-ray results. Document  Released: 08/28/2000 Document Revised: 05/21/2013 Document Reviewed: 10/15/2010 [facility] Patient Information 2015 [place], [place]. This information is not intended to replace advice given to you by your health care provider. Make sure you discuss any questions you have with your health care provider.

## 2014-11-12 NOTE — ED Notes (Signed)
Pt c/o right knee swelling and pain x 4 days no injury

## 2015-02-01 ENCOUNTER — Emergency Department (HOSPITAL_BASED_OUTPATIENT_CLINIC_OR_DEPARTMENT_OTHER)
Admission: EM | Admit: 2015-02-01 | Discharge: 2015-02-01 | Disposition: A | Discharge status: Home / Self Care | Payer: Medicaid Other | Attending: Emergency Medicine | Admitting: Emergency Medicine

## 2015-02-01 ENCOUNTER — Encounter (HOSPITAL_BASED_OUTPATIENT_CLINIC_OR_DEPARTMENT_OTHER): Payer: Self-pay | Admitting: *Deleted

## 2015-02-01 DIAGNOSIS — L989 Disorder of the skin and subcutaneous tissue, unspecified: Secondary | ICD-10-CM | POA: Insufficient documentation

## 2015-02-01 DIAGNOSIS — Z720 Tobacco use: Secondary | ICD-10-CM | POA: Insufficient documentation

## 2015-02-01 MED ORDER — MUPIROCIN CALCIUM 2 % EX CREA
TOPICAL_CREAM | Freq: Three times a day (TID) | CUTANEOUS | Status: DC
  Administered 2015-02-01: via TOPICAL
  Filled 2015-02-01: qty 15

## 2015-02-01 NOTE — ED Notes (Signed)
Pt reports abscess to right axila since Friday.

## 2015-02-01 NOTE — ED Provider Notes (Signed)
CSN: [id number]     Arrival date & time 02/01/15  2250 History  This chart was scribed for Paula Libra, MD by Lyndel Safe, ED Scribe. This patient was seen in room MH12/MH12 and the patient's care was started 11:40 PM.  Chief Complaint  Patient presents with  . Abscess   The history is provided by the patient. No language interpreter was used.   HPI Comments: Ian Mercado is a 24 y.o. male who presents to the Emergency Department complaining of an area of gradually worsening pain and erythema to right axilla onset 3 days ago. He also reports a similar area in the same region that is smaller in size onset today. Symtpoms are mild to moderate, worse with palpation. The pt denies experiencing similar areas of pain and erythema in the past. Denies any other affected areas. Denies systemic symtoms.  History reviewed. No pertinent past medical history. History reviewed. No pertinent past surgical history. No family history on file. Social History  Substance Use Topics  . Smoking status: Current Some Day Smoker -- 0.10 packs/day for 1 years    Types: Cigars  . Smokeless tobacco: Never Used  . Alcohol Use: No    Review of Systems A complete 10 system review of systems was obtained and is otherwise negative except at noted in the HPI and PMH.  Allergies  Review of patient's allergies indicates no known allergies.  Home Medications   Prior to Admission medications   Medication Sig Start Date End Date Taking? Authorizing Provider  desloratadine (CLARINEX) 5 MG tablet Take 1 tablet (5 mg total) by mouth daily. 10/19/11 10/18/12  April Palumbo, MD   BP 134/68 mmHg  Pulse 65  Temp(Src) 98 F (36.7 C) (Oral)  Resp 18  Ht  (1.803 m)  Wt 152 lb (68.947 kg)  BMI 21.21 kg/m2  SpO2 100% Physical Exam  General: Well-developed, well-nourished male in no acute distress; appearance consistent with age of record HENT: normocephalic; atraumatic Eyes: pupils equal, round and reactive to  light; extraocular muscles intact Neck: supple Heart: regular rate and rhythm Lungs: clear to auscultation bilaterally Abdomen: soft; nondistended; nontender; no masses or hepatosplenomegaly; bowel sounds present Extremities: No deformity; full range of motion; pulses normal; crusted lesion of right axilla with smaller lesion several centimeters below, lesions are non-indurated, non-fluctuant with no associated lymphadenopathy Neurologic: Awake, alert and oriented; motor function intact in all extremities and symmetric; no facial droop Skin: Warm and dry Psychiatric: Normal mood and affect  ED Course  Procedures  DIAGNOSTIC STUDIES: Oxygen Saturation is 100% on RA, normal by my interpretation.    COORDINATION OF CARE: 11:45 PM Discussed treatment plan with pt. Pt acknowledges and agrees to plan. I&D not indicated at the present time. We will treat with topical mupirocin. He was advised to return should they worsen.   MDM   Final diagnoses:  Skin lesion   I personally performed the services described in this documentation, which was scribed in my presence. The recorded information has been reviewed and is accurate.    Paula Libra, MD 02/01/15 2350

## 2015-06-14 ENCOUNTER — Encounter (HOSPITAL_BASED_OUTPATIENT_CLINIC_OR_DEPARTMENT_OTHER): Payer: Self-pay | Admitting: Emergency Medicine

## 2015-06-14 ENCOUNTER — Emergency Department (HOSPITAL_BASED_OUTPATIENT_CLINIC_OR_DEPARTMENT_OTHER)
Admission: EM | Admit: 2015-06-14 | Discharge: 2015-06-14 | Disposition: A | Discharge status: Home / Self Care | Payer: Medicaid Other | Attending: Emergency Medicine | Admitting: Emergency Medicine

## 2015-06-14 ENCOUNTER — Emergency Department (HOSPITAL_BASED_OUTPATIENT_CLINIC_OR_DEPARTMENT_OTHER): Payer: Medicaid Other

## 2015-06-14 DIAGNOSIS — Y9389 Activity, other specified: Secondary | ICD-10-CM | POA: Insufficient documentation

## 2015-06-14 DIAGNOSIS — S8991XA Unspecified injury of right lower leg, initial encounter: Secondary | ICD-10-CM | POA: Insufficient documentation

## 2015-06-14 DIAGNOSIS — Y9289 Other specified places as the place of occurrence of the external cause: Secondary | ICD-10-CM | POA: Insufficient documentation

## 2015-06-14 DIAGNOSIS — F17210 Nicotine dependence, cigarettes, uncomplicated: Secondary | ICD-10-CM | POA: Insufficient documentation

## 2015-06-14 DIAGNOSIS — X58XXXA Exposure to other specified factors, initial encounter: Secondary | ICD-10-CM | POA: Insufficient documentation

## 2015-06-14 DIAGNOSIS — M25561 Pain in right knee: Secondary | ICD-10-CM

## 2015-06-14 DIAGNOSIS — Y998 Other external cause status: Secondary | ICD-10-CM | POA: Insufficient documentation

## 2015-06-14 MED ORDER — NAPROXEN 500 MG PO TABS: 500.0000 mg | ORAL_TABLET | Freq: Two times a day (BID) | ORAL | Status: DC

## 2015-06-14 NOTE — ED Provider Notes (Signed)
CSN: [id number]     Arrival date & time 06/14/15  1402 History   First MD Initiated Contact with Patient 06/14/15 1444     Chief Complaint  Patient presents with  . Joint Swelling     (Consider location/radiation/quality/duration/timing/severity/associated sxs/prior Treatment) HPI Comments: Patient presents with complaint of right knee pain, worse with ambulation starting today. Patient did have an injury approximately one week ago where he began having discomfort while shoveling snow from his truck. No twisting injury. Patient states his knee was planted and had some discomfort when he pushed off with his leg. Patient describes this as mild and rapidly improving. He has not had any pain or problems over the past several days. No new injuries. Patient woke up today with worsening pain and swelling in his knee. He is able to walk but has increased pain with walking. No treatments prior to arrival. No previous history of injury. Onset of symptoms acute. Course is constant.  The history is provided by the patient.    History reviewed. No pertinent past medical history. History reviewed. No pertinent past surgical history. History reviewed. No pertinent family history. Social History  Substance Use Topics  . Smoking status: Current Some Day Smoker -- 0.10 packs/day for 1 years    Types: Cigars  . Smokeless tobacco: Never Used  . Alcohol Use: No    Review of Systems  Constitutional: Negative for activity change.  Musculoskeletal: Positive for joint swelling, arthralgias and gait problem. Negative for back pain and neck pain.  Skin: Negative for wound.  Neurological: Negative for weakness and numbness.    Allergies  Review of patient's allergies indicates no known allergies.  Home Medications   Prior to Admission medications   Medication Sig Start Date End Date Taking? Authorizing Provider  desloratadine (CLARINEX) 5 MG tablet Take 1 tablet (5 mg total) by mouth daily. 10/19/11  10/18/12  April Palumbo, MD   BP 113/68 mmHg  Pulse 69  Temp(Src) 98.6 F (37 C) (Oral)  Resp 20  Ht 6' (1.829 m)  Wt 66.679 kg  BMI 19.93 kg/m2  SpO2 100%   Physical Exam  Constitutional: He appears well-developed and well-nourished.  HENT:  Head: Normocephalic and atraumatic.  Eyes: Conjunctivae are normal.  Neck: Normal range of motion. Neck supple.  Cardiovascular: Normal pulses.   Musculoskeletal: He exhibits tenderness. He exhibits no edema.       Right hip: Normal.       Right knee: He exhibits normal range of motion, no swelling and no effusion. Tenderness found.       Left knee: Normal.       Right ankle: Normal.       Legs: Neurological: He is alert. No sensory deficit.  Motor, sensation, and vascular distal to the injury is fully intact. Antalgic gait.  Skin: Skin is warm and dry.  Psychiatric: He has a normal mood and affect.  Nursing note and vitals reviewed.   ED Course  Procedures (including critical care time) Labs Review Labs Reviewed - No data to display  Imaging Review Dg Knee Complete 4 Views Right  06/14/2015  CLINICAL DATA:  Larey SeatFell right knee pop last week and woke up this morning with right knee pain and swelling. EXAM: RIGHT KNEE - COMPLETE 4+ VIEW COMPARISON:  None. FINDINGS: There is no evidence of fracture, dislocation, or joint effusion. There is no evidence of arthropathy or other focal bone abnormality. Soft tissues are unremarkable. IMPRESSION: Negative. Electronically Signed   By: Reuel Boomaniel  Micheline Maze M.D.   On: 06/14/2015 14:36   I have personally reviewed and evaluated these images and lab results as part of my medical decision-making.   EKG Interpretation None       3:09 PM Patient seen and examined.  Vital signs reviewed and are as follows: BP 113/68 mmHg  Pulse 69  Temp(Src) 98.6 F (37 C) (Oral)  Resp 20  Ht 6' (1.829 m)  Wt 66.679 kg  BMI 19.93 kg/m2  SpO2 100%  Reviewed x-ray results.   Encourage follow-up with orthopedics  in one week if not improved. Referral given.  Patient was counseled on RICE protocol and told to rest injury, use ice for no longer than 15 minutes every hour, compress the area, and elevate above the level of their heart as much as possible to reduce swelling. Questions answered. Patient verbalized understanding.      MDM   Final diagnoses:  Knee pain, acute, right   Right knee pain. Negative x-rays. Patient is ambulatory. Low suspicion for occult tibial plateau fracture. No new injury.    Renne Crigler, PA-C 06/14/15 1541  Vanetta Mulders, MD 06/15/15 1044

## 2015-06-14 NOTE — Discharge Instructions (Signed)
Please read and follow all provided instructions.  Your diagnoses today include:  1. Knee pain, acute, right     Tests performed today include:  An x-ray of the affected area - does NOT show any broken bones  Vital signs. See below for your results today.   Medications prescribed:   Naproxen - anti-inflammatory pain medication  Do not exceed 500mg  naproxen every 12 hours, take with food  You have been prescribed an anti-inflammatory medication or NSAID. Take with food. Take smallest effective dose for the shortest duration needed for your pain. Stop taking if you experience stomach pain or vomiting.   Take any prescribed medications only as directed.  Home care instructions:   Follow any educational materials contained in this packet  Follow R.I.C.E. Protocol:  R - rest your injury   I  - use ice on injury without applying directly to skin  C - compress injury with bandage or splint  E - elevate the injury as much as possible  Follow-up instructions: Please follow-up with your primary care provider or the provided orthopedic physician (bone specialist) if you continue to have significant pain in 1 week. In this case you may have a more severe injury that requires further care.   Return instructions:   Please return if your toes or feet are numb or tingling, appear gray or blue, or you have severe pain (also elevate the leg and loosen splint or wrap if you were given one)  Please return to the Emergency Department if you experience worsening symptoms.   Please return if you have any other emergent concerns.  Additional Information:  Your vital signs today were: BP 113/68 mmHg   Pulse 69   Temp(Src) 98.6 F (37 C) (Oral)   Resp 20   Ht 6' (1.829 m)   Wt 66.679 kg   BMI 19.93 kg/m2   SpO2 100% If your blood pressure (BP) was elevated above 135/85 this visit, please have this repeated by your doctor within one month. --------------

## 2015-06-14 NOTE — ED Notes (Signed)
Patient states that last week he felt his right knee pop. Today woke up and same knee is swollen and hurting

## 2017-01-13 ENCOUNTER — Emergency Department (HOSPITAL_BASED_OUTPATIENT_CLINIC_OR_DEPARTMENT_OTHER)
Admission: EM | Admit: 2017-01-13 | Discharge: 2017-01-14 | Disposition: A | Discharge status: Home / Self Care | Payer: BLUE CROSS/BLUE SHIELD | Attending: Emergency Medicine | Admitting: Emergency Medicine

## 2017-01-13 ENCOUNTER — Encounter (HOSPITAL_BASED_OUTPATIENT_CLINIC_OR_DEPARTMENT_OTHER): Payer: Self-pay | Admitting: Emergency Medicine

## 2017-01-13 ENCOUNTER — Emergency Department (HOSPITAL_BASED_OUTPATIENT_CLINIC_OR_DEPARTMENT_OTHER): Payer: BLUE CROSS/BLUE SHIELD

## 2017-01-13 DIAGNOSIS — R609 Edema, unspecified: Secondary | ICD-10-CM

## 2017-01-13 DIAGNOSIS — Z79899 Other long term (current) drug therapy: Secondary | ICD-10-CM | POA: Diagnosis not present

## 2017-01-13 DIAGNOSIS — M7122 Synovial cyst of popliteal space [Baker], left knee: Secondary | ICD-10-CM | POA: Diagnosis not present

## 2017-01-13 DIAGNOSIS — M79605 Pain in left leg: Secondary | ICD-10-CM

## 2017-01-13 DIAGNOSIS — M79662 Pain in left lower leg: Secondary | ICD-10-CM | POA: Diagnosis present

## 2017-01-13 DIAGNOSIS — R52 Pain, unspecified: Secondary | ICD-10-CM

## 2017-01-13 DIAGNOSIS — F17290 Nicotine dependence, other tobacco product, uncomplicated: Secondary | ICD-10-CM | POA: Insufficient documentation

## 2017-01-13 MED ORDER — NAPROXEN 375 MG PO TABS: 375.0000 mg | ORAL_TABLET | Freq: Two times a day (BID) | ORAL | 0 refills | Status: DC

## 2017-01-13 NOTE — ED Triage Notes (Signed)
Pain in left knee radiating down back of left leg for "months.".  Swelling to left shin just started this week. No known injury.  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 Walks with brisk gait.  No limp.

## 2017-01-13 NOTE — ED Provider Notes (Signed)
[facility] Provider Note   CSN: [id number] Arrival date & time: 01/13/17  2207     History   Chief Complaint Chief Complaint  Patient presents with  . Leg Pain    HPI Ian Mercado is a 26 y.o. male.  Patient presents with left lower leg pain and swelling, along with pain at the back of the left knee.  History of prior knee injury.    Knee Pain   This is a recurrent problem. The current episode started more than 1 week ago. The problem has not changed since onset.The pain is present in the right knee. The quality of the pain is described as intermittent. The pain is moderate. Associated symptoms include stiffness.    History reviewed. No pertinent past medical history.  There are no active problems to display for this patient.   History reviewed. No pertinent surgical history.     Home Medications    Prior to Admission medications   Medication Sig Start Date End Date Taking? Authorizing Provider  naproxen (NAPROSYN) 375 MG tablet Take 1 tablet (375 mg total) by mouth 2 (two) times daily. 01/13/17   Felicie Morn, NP  naproxen (NAPROSYN) 500 MG tablet Take 1 tablet (500 mg total) by mouth 2 (two) times daily. 06/14/15   Renne Crigler, PA-C    Family History No family history on file.  Social History Social History  Substance Use Topics  . Smoking status: Current Some Day Smoker    Packs/day: 0.10    Years: 1.00    Types: Cigars  . Smokeless tobacco: Never Used  . Alcohol use No     Allergies   Patient has no known allergies.   Review of Systems Review of Systems  Musculoskeletal: Positive for arthralgias, joint swelling, myalgias and stiffness.  All other systems reviewed and are negative.    Physical Exam Updated Vital Signs BP 119/71 (BP Location: Left Arm)   Pulse 65   Temp 98.4 F (36.9 C) (Oral)   Resp 16   Ht 6' (1.829 m)   Wt 70.3 kg (155 lb)   SpO2 100%   BMI 21.02 kg/m   Physical Exam  Constitutional: He is  oriented to person, place, and time. He appears well-developed and well-nourished.  HENT:  Head: Normocephalic.  Eyes: Conjunctivae are normal.  Neck: Neck supple.  Cardiovascular: Normal rate and regular rhythm.   Pulmonary/Chest: Effort normal and breath sounds normal.  Abdominal: Soft. Bowel sounds are normal.  Musculoskeletal: He exhibits edema and tenderness. He exhibits no deformity.       Legs: Neurological: He is alert and oriented to person, place, and time.  Skin: Skin is warm and dry.  Nursing note and vitals reviewed.    ED Treatments / Results  Labs (all labs ordered are listed, but only abnormal results are displayed) Labs Reviewed - No data to display  EKG  EKG Interpretation None       Radiology US Venous Img Lower Unilateral Left  Result Date: 01/13/2017 CLINICAL DATA:  Left leg pain and swelling. Patient is a semi truck driver. EXAM: LEFT LOWER EXTREMITY VENOUS DOPPLER ULTRASOUND TECHNIQUE: Gray-scale sonography with graded compression, as well as color Doppler and duplex ultrasound were performed to evaluate the lower extremity deep venous systems from the level of the common femoral vein and including the common femoral, femoral, profunda femoral, popliteal and calf veins including the posterior tibial, peroneal and gastrocnemius veins when visible. The superficial great saphenous vein was also interrogated.  Spectral Doppler was utilized to evaluate flow at rest and with distal augmentation maneuvers in the common femoral, femoral and popliteal veins. COMPARISON:  None. FINDINGS: Contralateral Common Femoral Vein: Respiratory phasicity is normal and symmetric with the symptomatic side. No evidence of thrombus. Normal compressibility. Common Femoral Vein: No evidence of thrombus. Normal compressibility, respiratory phasicity and response to augmentation. Saphenofemoral Junction: No evidence of thrombus. Normal compressibility and flow on color Doppler imaging.  Profunda Femoral Vein: No evidence of thrombus. Normal compressibility and flow on color Doppler imaging. Femoral Vein: No evidence of thrombus. Normal compressibility, respiratory phasicity and response to augmentation. Popliteal Vein: No evidence of thrombus. Normal compressibility, respiratory phasicity and response to augmentation. Calf Veins: No evidence of thrombus. Normal compressibility and flow on color Doppler imaging. Superficial Great Saphenous Vein: No evidence of thrombus. Normal compressibility and flow on color Doppler imaging. Venous Reflux:  None. Other Findings: Elongated hypovascular 1.8 x 0.6 x 1.8 cm hypoechoic collection in the medial popliteal fossa. IMPRESSION: No evidence of DVT within the left lower extremity. Small Baker cyst. Electronically Signed   By: Rubye Oaks M.D.   On: 01/13/2017 23:24    Procedures Procedures (including critical care time)  Medications Ordered in ED Medications - No data to display   Initial Impression / Assessment and Plan / ED Course  I have reviewed the triage vital signs and the nursing notes.  Pertinent labs & imaging results that were available during my care of the patient were reviewed by me and considered in my medical decision making (see chart for details).     Patient ultrasound negative for DVT, positive for small baker's cyst.  Pt advised to follow up with orthopedics. Patient given knee sleeve while in ED, conservative therapy recommended and discussed. Patient will be discharged home & is agreeable with above plan. Returns precautions discussed. Pt appears safe for discharge.  Final Clinical Impressions(s) / ED Diagnoses   Final diagnoses:  Swelling  Pain  Left leg pain  Baker cyst, left    New Prescriptions New Prescriptions   NAPROXEN (NAPROSYN) 375 MG TABLET    Take 1 tablet (375 mg total) by mouth 2 (two) times daily.     Felicie Morn, NP 01/14/17 0004    Paula Libra, MD 01/14/17 [phone]

## 2017-02-23 ENCOUNTER — Encounter (HOSPITAL_BASED_OUTPATIENT_CLINIC_OR_DEPARTMENT_OTHER): Payer: Self-pay | Admitting: Adult Health

## 2017-02-23 ENCOUNTER — Emergency Department (HOSPITAL_BASED_OUTPATIENT_CLINIC_OR_DEPARTMENT_OTHER)
Admission: EM | Admit: 2017-02-23 | Discharge: 2017-02-23 | Disposition: A | Discharge status: Home / Self Care | Payer: BLUE CROSS/BLUE SHIELD | Attending: Emergency Medicine | Admitting: Emergency Medicine

## 2017-02-23 DIAGNOSIS — R369 Urethral discharge, unspecified: Secondary | ICD-10-CM | POA: Diagnosis present

## 2017-02-23 DIAGNOSIS — F17290 Nicotine dependence, other tobacco product, uncomplicated: Secondary | ICD-10-CM | POA: Diagnosis not present

## 2017-02-23 DIAGNOSIS — Z79899 Other long term (current) drug therapy: Secondary | ICD-10-CM | POA: Diagnosis not present

## 2017-02-23 DIAGNOSIS — A64 Unspecified sexually transmitted disease: Secondary | ICD-10-CM | POA: Diagnosis not present

## 2017-02-23 LAB — URINALYSIS, MICROSCOPIC (REFLEX)

## 2017-02-23 LAB — URINALYSIS, ROUTINE W REFLEX MICROSCOPIC
Glucose, UA: NEGATIVE mg/dL
Ketones, ur: NEGATIVE mg/dL
NITRITE: NEGATIVE
PH: 6 (ref 5.0–8.0)
Protein, ur: NEGATIVE mg/dL

## 2017-02-23 MED ORDER — AZITHROMYCIN 250 MG PO TABS
1000.0000 mg | ORAL_TABLET | Freq: Once | ORAL | Status: AC
  Administered 2017-02-23: 1000 mg via ORAL
  Filled 2017-02-23: qty 4

## 2017-02-23 MED ORDER — CEFTRIAXONE SODIUM 250 MG IJ SOLR
250.0000 mg | Freq: Once | INTRAMUSCULAR | Status: AC
  Administered 2017-02-23: 250 mg via INTRAMUSCULAR
  Filled 2017-02-23: qty 250

## 2017-02-23 NOTE — Discharge Instructions (Signed)
You should hear from [place] in around 2 days. If your cultures are positive.

## 2017-02-23 NOTE — ED Triage Notes (Signed)
PResents with d/c that is yellow in color from penis, has male partners. Unsure if they are having symptoms.

## 2017-02-23 NOTE — ED Provider Notes (Addendum)
[facility]   CSN: [id number] Arrival date & time: 02/23/17  1814     History   Chief Complaint Chief Complaint  Patient presents with  . SEXUALLY TRANSMITTED DISEASE    HPI Ian Mercado is a 26 y.o. male.  HPI Patient resents with pain with urination. States began this morning. Has yellow discharge from his penis also. States he did have unprotected sex with a new male partner to 3 days ago. Does not know if she is having symptoms. No abdominal pain. States for the last couple months he has noticed some bumps in his genital area also. They are not painful. History reviewed. No pertinent past medical history.  There are no active problems to display for this patient.   History reviewed. No pertinent surgical history.     Home Medications    Prior to Admission medications   Medication Sig Start Date End Date Taking? Authorizing Provider  naproxen (NAPROSYN) 375 MG tablet Take 1 tablet (375 mg total) by mouth 2 (two) times daily. 01/13/17   Felicie Morn, NP  naproxen (NAPROSYN) 500 MG tablet Take 1 tablet (500 mg total) by mouth 2 (two) times daily. 06/14/15   Renne Crigler, PA-C    Family History History reviewed. No pertinent family history.  Social History Social History  Substance Use Topics  . Smoking status: Current Some Day Smoker    Packs/day: 0.10    Years: 1.00    Types: Cigars  . Smokeless tobacco: Never Used  . Alcohol use No     Allergies   Patient has no known allergies.   Review of Systems Review of Systems  Constitutional: Negative for appetite change and fever.  HENT: Negative for congestion.   Respiratory: Negative for cough.   Cardiovascular: Negative for chest pain.  Gastrointestinal: Negative for abdominal distention.  Genitourinary: Positive for discharge and dysuria. Negative for difficulty urinating.  Musculoskeletal: Negative for arthralgias.  Skin: Negative for color change.  Neurological:  Negative for syncope.  Hematological: Negative for adenopathy.  Psychiatric/Behavioral: Negative for confusion.     Physical Exam Updated Vital Signs BP 114/69   Pulse 95   Temp 99.4 F (37.4 C) (Oral)   Resp 15   Ht 6' (1.829 m)   Wt 70.3 kg (155 lb)   SpO2 100%   BMI 21.02 kg/m   Physical Exam  Constitutional: He appears well-developed.  HENT:  Head: Atraumatic.  Cardiovascular: Normal rate.   Abdominal: There is no tenderness.  Genitourinary: Penis normal.  Genitourinary Comments: In his pubic hair area there are about 5 raised areas look more like moles. States they are new. No other penile lesions. No testicular tenderness. No frank penile discharge expressed.  Neurological: He is alert.  Skin: Skin is warm. Capillary refill takes less than 2 seconds.     ED Treatments / Results  Labs (all labs ordered are listed, but only abnormal results are displayed) Labs Reviewed  URINALYSIS, ROUTINE W REFLEX MICROSCOPIC - Abnormal; Notable for the following:       Result Value   Specific Gravity, Urine >1.030 (*)    Hgb urine dipstick TRACE (*)    Bilirubin Urine SMALL (*)    Leukocytes, UA TRACE (*)    All other components within normal limits  URINALYSIS, MICROSCOPIC (REFLEX) - Abnormal; Notable for the following:    Bacteria, UA RARE (*)    Squamous Epithelial / LPF 0-5 (*)    All other components within normal limits  RPR  HIV ANTIBODY (ROUTINE TESTING)  GC/CHLAMYDIA PROBE AMP ([place]) NOT AT [facility]    EKG  EKG Interpretation None       Radiology No results found.  Procedures Procedures (including critical care time)  Medications Ordered in ED Medications  cefTRIAXone (ROCEPHIN) injection 250 mg (250 mg Intramuscular Given 02/23/17 1846)  azithromycin (ZITHROMAX) tablet 1,000 mg (1,000 mg Oral Given 02/23/17 1845)     Initial Impression / Assessment and Plan / ED Course  I have reviewed the triage vital signs and the nursing notes.  Pertinent  labs & imaging results that were available during my care of the patient were reviewed by me and considered in my medical decision making (see chart for details).     Patient with dysuria. Possible STD exposure. Empirically treated and culture sent.  Final Clinical Impressions(s) / ED Diagnoses   Final diagnoses:  STD (sexually transmitted disease)    New Prescriptions Discharge Medication List as of 02/23/2017  7:20 PM       Benjiman Core, MD 02/24/17 Leanord Hawking    Benjiman Core, MD 02/24/17 [phone]

## 2017-02-24 LAB — RPR: RPR Ser Ql: NONREACTIVE

## 2017-02-24 LAB — GC/CHLAMYDIA PROBE AMP (~~LOC~~) NOT AT ARMC
Chlamydia: NEGATIVE
NEISSERIA GONORRHEA: POSITIVE — AB

## 2017-02-24 LAB — HIV ANTIBODY (ROUTINE TESTING W REFLEX): HIV SCREEN 4TH GENERATION: NONREACTIVE

## 2017-12-22 ENCOUNTER — Encounter (HOSPITAL_BASED_OUTPATIENT_CLINIC_OR_DEPARTMENT_OTHER): Payer: Self-pay | Admitting: Emergency Medicine

## 2017-12-22 ENCOUNTER — Emergency Department (HOSPITAL_BASED_OUTPATIENT_CLINIC_OR_DEPARTMENT_OTHER)
Admission: EM | Admit: 2017-12-22 | Discharge: 2017-12-23 | Disposition: A | Discharge status: Home / Self Care | Payer: BLUE CROSS/BLUE SHIELD | Attending: Emergency Medicine | Admitting: Emergency Medicine

## 2017-12-22 DIAGNOSIS — Z79899 Other long term (current) drug therapy: Secondary | ICD-10-CM | POA: Insufficient documentation

## 2017-12-22 DIAGNOSIS — N342 Other urethritis: Secondary | ICD-10-CM | POA: Insufficient documentation

## 2017-12-22 DIAGNOSIS — I10 Essential (primary) hypertension: Secondary | ICD-10-CM | POA: Insufficient documentation

## 2017-12-22 DIAGNOSIS — F17200 Nicotine dependence, unspecified, uncomplicated: Secondary | ICD-10-CM | POA: Insufficient documentation

## 2017-12-22 DIAGNOSIS — E119 Type 2 diabetes mellitus without complications: Secondary | ICD-10-CM | POA: Insufficient documentation

## 2017-12-22 NOTE — ED Triage Notes (Signed)
Pt states he has burning with urination  Onset of sxs today

## 2017-12-23 LAB — URINALYSIS, ROUTINE W REFLEX MICROSCOPIC
Bilirubin Urine: NEGATIVE
GLUCOSE, UA: NEGATIVE mg/dL
Hgb urine dipstick: NEGATIVE
KETONES UR: NEGATIVE mg/dL
Leukocytes, UA: NEGATIVE
Nitrite: NEGATIVE
PROTEIN: NEGATIVE mg/dL
Specific Gravity, Urine: >1.03 — ABNORMAL HIGH (ref 1.005–1.030)
pH: 6 (ref 5.0–8.0)

## 2017-12-23 MED ORDER — LIDOCAINE HCL (PF) 1 % IJ SOLN
INTRAMUSCULAR | Status: AC
  Administered 2017-12-23: 5 mL
  Filled 2017-12-23: qty 5

## 2017-12-23 MED ORDER — CEFTRIAXONE SODIUM 250 MG IJ SOLR
250.0000 mg | Freq: Once | INTRAMUSCULAR | Status: AC
  Administered 2017-12-23: 250 mg via INTRAMUSCULAR
  Filled 2017-12-23: qty 250

## 2017-12-23 MED ORDER — AZITHROMYCIN 250 MG PO TABS
1000.0000 mg | ORAL_TABLET | Freq: Once | ORAL | Status: AC
  Administered 2017-12-23: 1000 mg via ORAL
  Filled 2017-12-23: qty 4

## 2017-12-23 NOTE — ED Provider Notes (Signed)
[facility] Provider Note: Lowella Dell, MD, FACEP  CSN: [id number] MRN: [id number] ARRIVAL: 12/22/17 at 2346 ROOM: [facility]   CHIEF COMPLAINT  Dysuria   HISTORY OF PRESENT ILLNESS  12/23/17 12:34 AM Ian Mercado is a 27 y.o. male with a 1 day history of burning with urination.  The burning is not severe.  He has not noticed any urethral discharge.  He has no abdominal pain or other GI complaints.  He does have a history of STDs in the past.   History reviewed. No pertinent past medical history.  History reviewed. No pertinent surgical history.  Family History  Problem Relation Age of Onset  . Diabetes Mother   . Hypertension Mother   . Diabetes Other   . Hypertension Other     Social History   Tobacco Use  . Smoking status: Current Some Day Smoker    Packs/day: 0.10    Years: 1.00    Pack years: 0.10    Types: Cigars  . Smokeless tobacco: Never Used  Substance Use Topics  . Alcohol use: No  . Drug use: No    Prior to Admission medications   Medication Sig Start Date End Date Taking? Authorizing Provider  naproxen (NAPROSYN) 375 MG tablet Take 1 tablet (375 mg total) by mouth 2 (two) times daily. 01/13/17   Felicie Morn, NP  naproxen (NAPROSYN) 500 MG tablet Take 1 tablet (500 mg total) by mouth 2 (two) times daily. 06/14/15   Renne Crigler, PA-C  desloratadine (CLARINEX) 5 MG tablet Take 1 tablet (5 mg total) by mouth daily. 10/19/11 06/14/15  Palumbo, April, MD    Allergies Patient has no known allergies.   REVIEW OF SYSTEMS  Negative except as noted here or in the History of Present Illness.   PHYSICAL EXAMINATION  Initial Vital Signs Blood pressure 124/75, pulse 62, temperature 98.6 F (37 C), temperature source Oral, resp. rate 14, height 6' (1.829 m), weight 65.3 kg (144 lb), SpO2 100 %.  Examination General: Well-developed, well-nourished male in no acute distress; appearance consistent with age of record HENT: normocephalic;  atraumatic Eyes: Normal appearance Neck: supple Heart: regular rate and rhythm Lungs: clear to auscultation bilaterally Abdomen: soft; nondistended; nontender; bowel sounds present GU: Tanner V male, circumcised; slight watery urethral discharge; no testicular tenderness Extremities: No deformity; full range of motion Neurologic: Awake, alert and oriented; motor function intact in all extremities and symmetric; no facial droop Skin: Warm and dry Psychiatric: Normal mood and affect   RESULTS  Summary of this visit's results, reviewed by myself:   EKG Interpretation  Date/Time:    Ventricular Rate:    PR Interval:    QRS Duration:   QT Interval:    QTC Calculation:   R Axis:     Text Interpretation:        Laboratory Studies: Results for orders placed or performed during the hospital encounter of 12/22/17 (from the past 24 hour(s))  Urinalysis, Routine w reflex microscopic- may I&O cath if menses     Status: Abnormal   Collection Time: 12/23/17 12:11 AM  Result Value Ref Range   Color, Urine YELLOW YELLOW   APPearance CLEAR CLEAR   Specific Gravity, Urine >1.030 (H) 1.005 - 1.030   pH 6.0 5.0 - 8.0   Glucose, UA NEGATIVE NEGATIVE mg/dL   Hgb urine dipstick NEGATIVE NEGATIVE   Bilirubin Urine NEGATIVE NEGATIVE   Ketones, ur NEGATIVE NEGATIVE mg/dL   Protein, ur NEGATIVE NEGATIVE mg/dL   Nitrite  NEGATIVE NEGATIVE   Leukocytes, UA NEGATIVE NEGATIVE   Imaging Studies: No results found.  ED COURSE and MDM  Nursing notes and initial vitals signs, including pulse oximetry, reviewed.  Vitals:   12/22/17 2351 12/22/17 2352  BP: 124/75   Pulse: 62   Resp: 14   Temp: 98.6 F (37 C)   TempSrc: Oral   SpO2: 100%   Weight:  65.3 kg (144 lb)  Height:  6' (1.829 m)   GC and chlamydia pending.  We will go ahead and treat for urethritis given symptoms and history of multiple STDs in the past.  PROCEDURES    ED DIAGNOSES     ICD-10-CM   1. Urethritis N34.2         Jermika Olden, MD 12/23/17 [phone]

## 2017-12-25 LAB — GC/CHLAMYDIA PROBE AMP (~~LOC~~) NOT AT ARMC
Chlamydia: POSITIVE — AB
Neisseria Gonorrhea: NEGATIVE

## 2018-07-10 IMAGING — US US EXTREM LOW VENOUS*L*
1 series · 13 of 24 positions shown · non-contrast
Comparison: None.

CLINICAL DATA: Left leg pain and swelling. Patient is a semi truck
driver.



[Series 1: us extrem low venous*left* · 0.05mm/px · 13 of 31 slices shown]
[im 1/31]
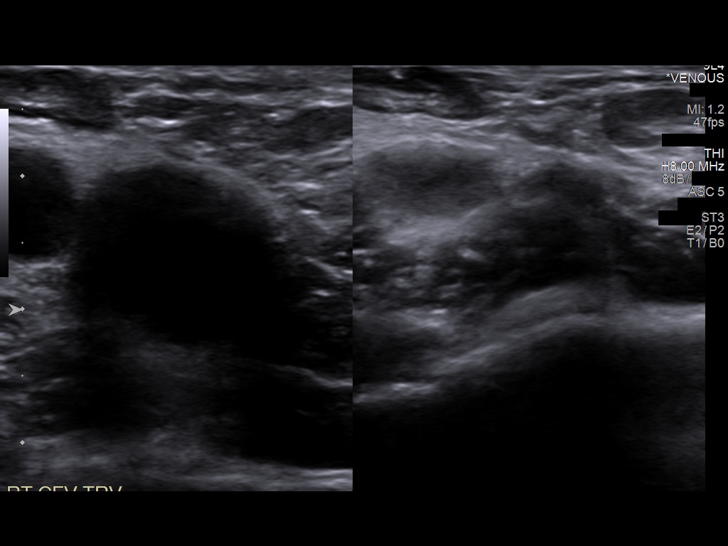
[im 3/31]
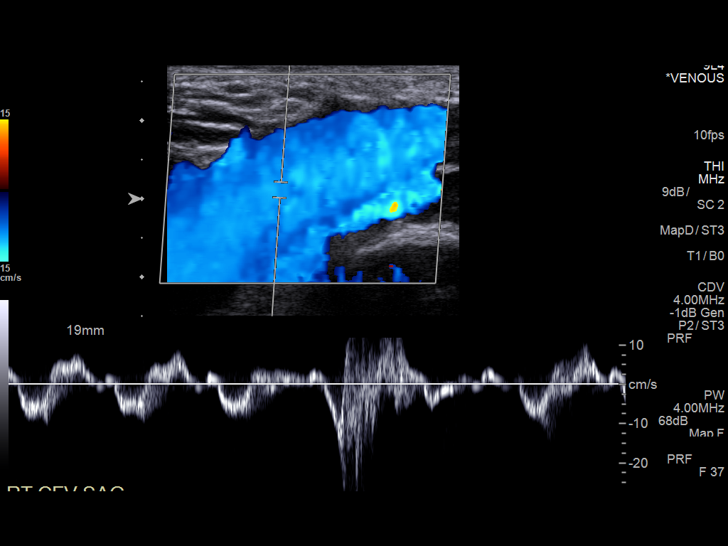
[im 6/31]
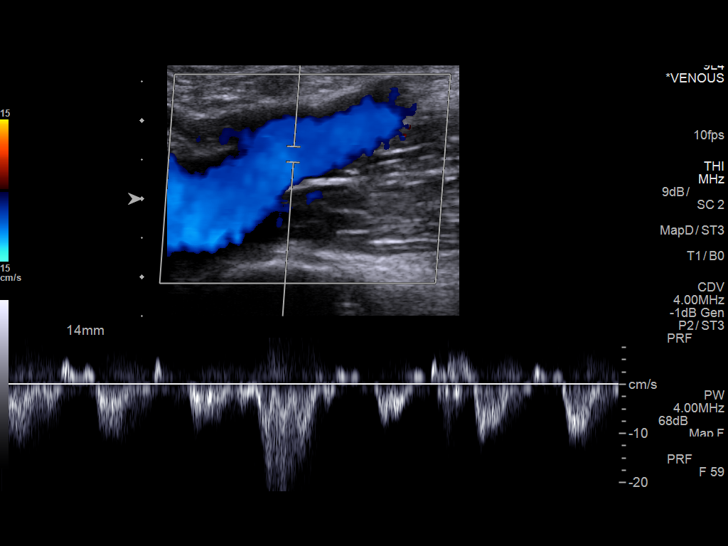
[im 8/31]
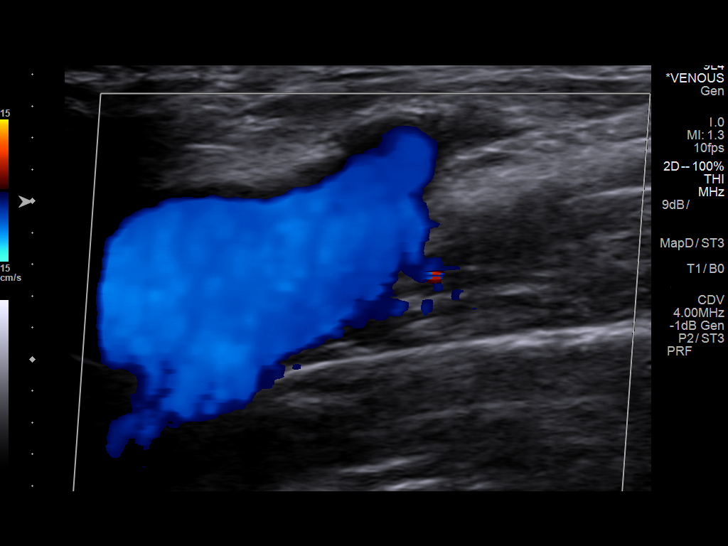
[im 11/31]
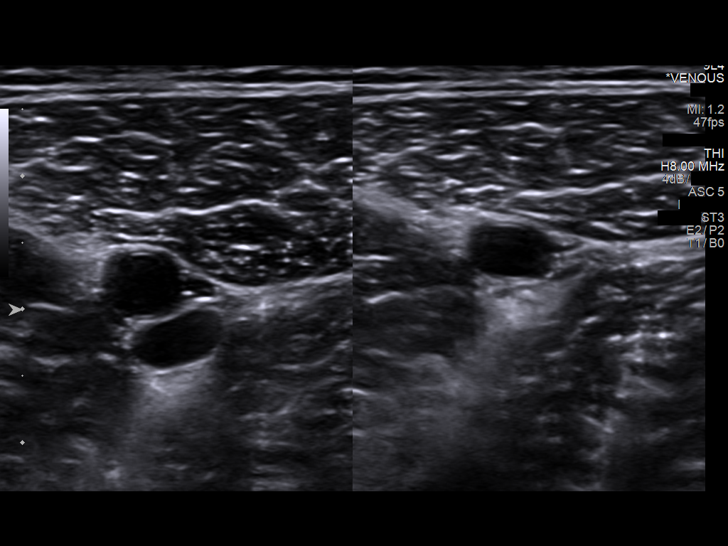
[im 14/31]
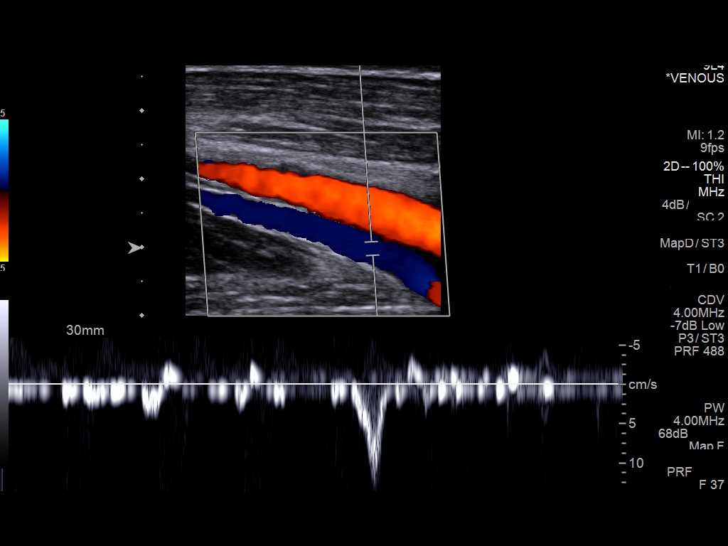
[im 16/31]
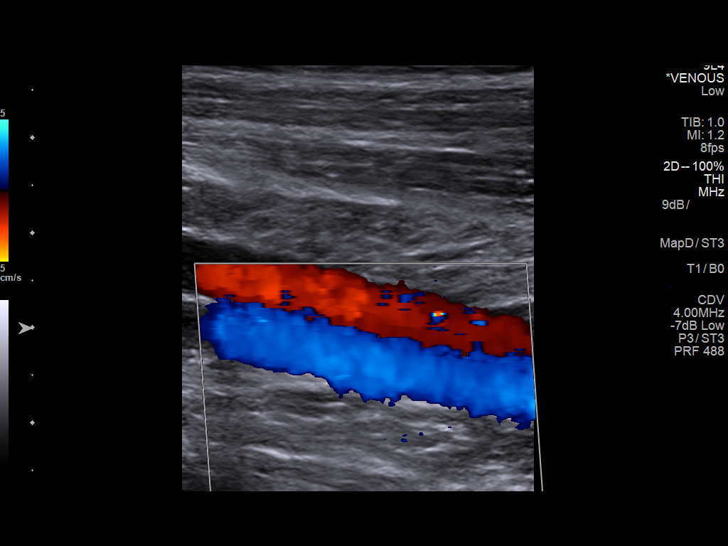
[im 17/31]
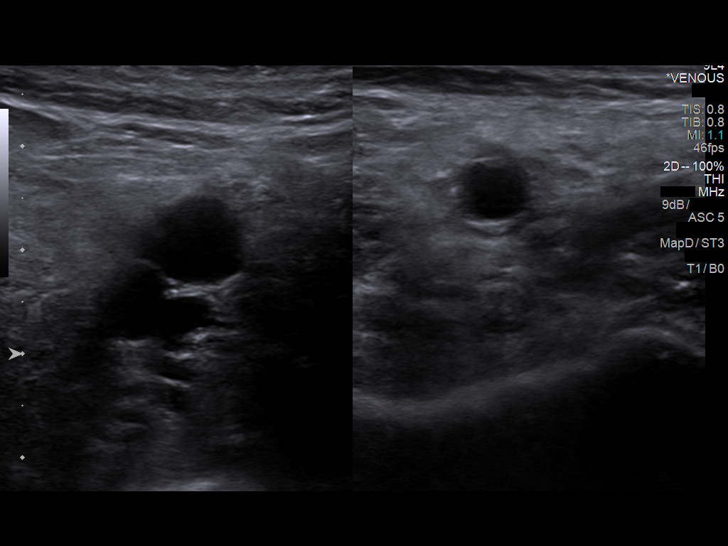
[im 20/31]
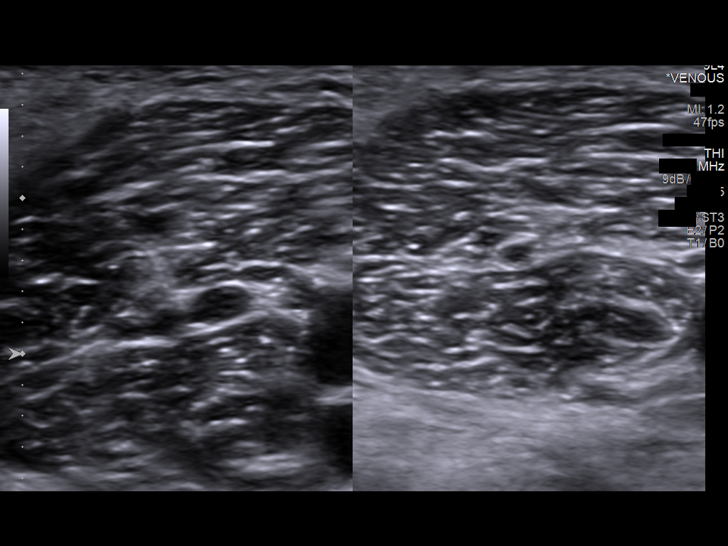
[im 23/31]
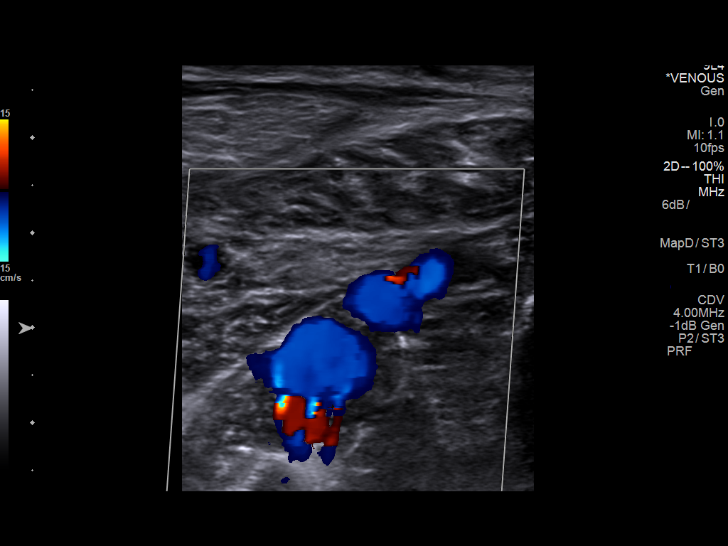
[im 25/31]
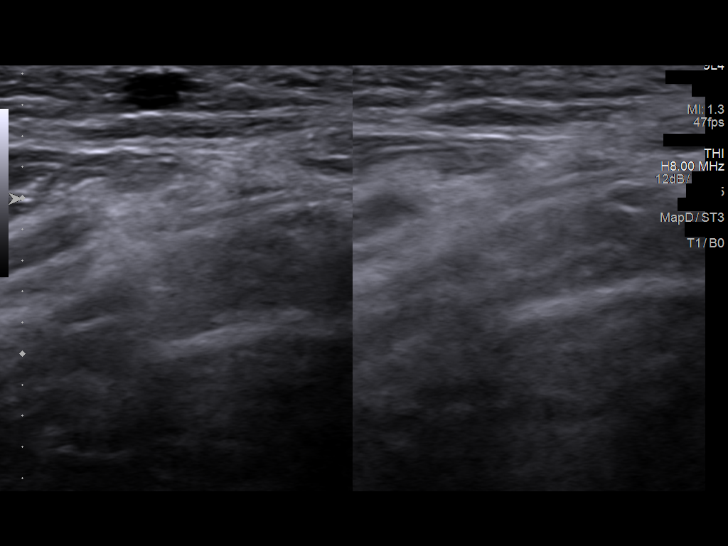
[im 28/31]
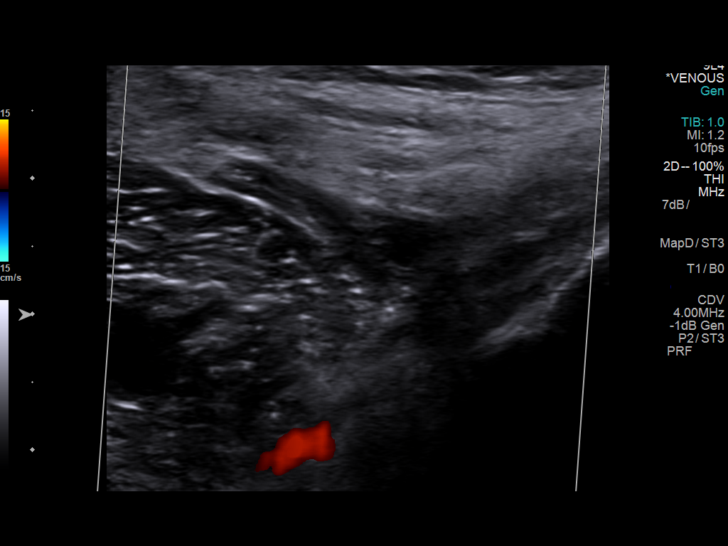
[im 31/31]
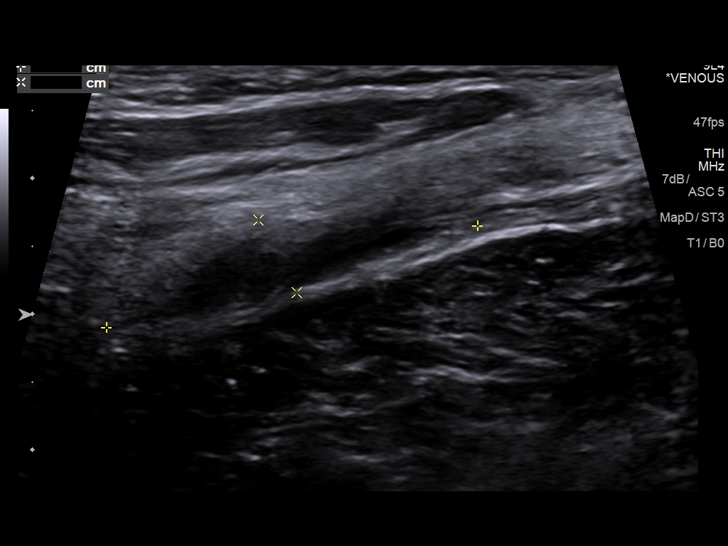

[13 of 24 positions shown; findings below may reference images not displayed]

FINDINGS: Contralateral Common Femoral Vein: Respiratory phasicity is normal
and symmetric with the symptomatic side. No evidence of thrombus.
Normal compressibility.

Common Femoral Vein: No evidence of thrombus. Normal
compressibility, respiratory phasicity and response to augmentation.

Saphenofemoral Junction: No evidence of thrombus. Normal
compressibility and flow on color Doppler imaging.

Profunda Femoral Vein: No evidence of thrombus. Normal
compressibility and flow on color Doppler imaging.

Femoral Vein: No evidence of thrombus. Normal compressibility,
respiratory phasicity and response to augmentation.

Popliteal Vein: No evidence of thrombus. Normal compressibility,
respiratory phasicity and response to augmentation.

Calf Veins: No evidence of thrombus. Normal compressibility and flow
on color Doppler imaging.

Superficial Great Saphenous Vein: No evidence of thrombus. Normal
compressibility and flow on color Doppler imaging.

Venous Reflux:  None.

Other Findings: Elongated hypovascular 1.8 x 0.6 x 1.8 cm hypoechoic
collection in the medial popliteal fossa.
IMPRESSION: No evidence of DVT within the left lower extremity.

Small Baker cyst.

## 2019-07-09 ENCOUNTER — Encounter (HOSPITAL_BASED_OUTPATIENT_CLINIC_OR_DEPARTMENT_OTHER): Payer: Self-pay | Admitting: *Deleted

## 2019-07-09 ENCOUNTER — Other Ambulatory Visit: Payer: Self-pay

## 2019-07-09 ENCOUNTER — Emergency Department (HOSPITAL_BASED_OUTPATIENT_CLINIC_OR_DEPARTMENT_OTHER)
Admission: EM | Admit: 2019-07-09 | Discharge: 2019-07-09 | Disposition: A | Discharge status: Home / Self Care | Payer: Self-pay | Attending: Emergency Medicine | Admitting: Emergency Medicine

## 2019-07-09 DIAGNOSIS — R319 Hematuria, unspecified: Secondary | ICD-10-CM | POA: Insufficient documentation

## 2019-07-09 DIAGNOSIS — F17210 Nicotine dependence, cigarettes, uncomplicated: Secondary | ICD-10-CM | POA: Insufficient documentation

## 2019-07-09 LAB — URINALYSIS, ROUTINE W REFLEX MICROSCOPIC
Bilirubin Urine: NEGATIVE
Glucose, UA: NEGATIVE mg/dL
Hgb urine dipstick: NEGATIVE
Ketones, ur: NEGATIVE mg/dL
Leukocytes,Ua: NEGATIVE
Nitrite: NEGATIVE
Protein, ur: NEGATIVE mg/dL
Specific Gravity, Urine: 1.025 (ref 1.005–1.030)
pH: 6 (ref 5.0–8.0)

## 2019-07-09 NOTE — ED Triage Notes (Signed)
Hematuria

## 2019-07-09 NOTE — Discharge Instructions (Signed)
You were seen in the emergency department today with concern for blood in the urine.  Your urine test here did not show blood, infection, or other abnormality.  If you develop pain with urination, abdominal pain\flank pain you should return to the emergency department as this may indicate a more serious issue.  Please follow with primary care physician.  If you continue to have intermittent blood in the urine you may need to see a urology specialist but a primary care doctor can decide when it is time to direct you to that service.

## 2019-07-09 NOTE — ED Provider Notes (Signed)
   Emergency Department Provider Note   I have reviewed the triage vital signs and the nursing notes.   HISTORY  Chief Complaint Hematuria   HPI Ian Mercado is a 29 y.o. male presents to the ED with dark red color to urine in the last 1-2 days. Patient denies abdominal/flank pain. No dysuria, hesitancy, or urgency. No fever. No chills. No strenuous exercise. Symptoms seem to have resolved at this point per patient. Denies STD concern or exposure.   History reviewed. No pertinent past medical history.  There are no problems to display for this patient.   History reviewed. No pertinent surgical history.  Allergies Patient has no known allergies.  Family History  Problem Relation Age of Onset  . Diabetes Mother   . Hypertension Mother   . Diabetes Other   . Hypertension Other     Social History Social History   Tobacco Use  . Smoking status: Current Some Day Smoker    Packs/day: 0.10    Years: 1.00    Pack years: 0.10    Types: Cigars  . Smokeless tobacco: Never Used  Substance Use Topics  . Alcohol use: No  . Drug use: No    Review of Systems  Constitutional: No fever/chills Gastrointestinal: No abdominal pain.   Genitourinary: Negative for dysuria, hesitancy, or urgency.   10-point ROS otherwise negative.  ____________________________________________   PHYSICAL EXAM:  VITAL SIGNS: ED Triage Vitals  Enc Vitals Group     BP 07/09/19 1831 112/63     Pulse Rate 07/09/19 1831 76     Resp 07/09/19 1831 20     Temp 07/09/19 1831 99.1 F (37.3 C)     Temp Source 07/09/19 1831 Oral     SpO2 07/09/19 1831 98 %     Weight 07/09/19 1830 158 lb (71.7 kg)     Height 07/09/19 1830 6' (1.829 m)   Constitutional: Alert and oriented. Well appearing and in no acute distress. Eyes: Conjunctivae are normal. Head: Atraumatic. Nose: No congestion/rhinnorhea. Mouth/Throat: Mucous membranes are moist.  Neck: No stridor.   Cardiovascular: Normal rate, regular  rhythm.  Respiratory: Normal respiratory effort. Gastrointestinal: Soft and nontender. No distention.  Musculoskeletal: No gross deformities of extremities. Neurologic:  Normal speech and language.  Skin:  Skin is warm, dry and intact. No rash noted.  ____________________________________________   LABS (all labs ordered are listed, but only abnormal results are displayed)  Labs Reviewed  URINALYSIS, ROUTINE W REFLEX MICROSCOPIC   ____________________________________________   PROCEDURES  Procedure(s) performed:   Procedures  None ____________________________________________   INITIAL IMPRESSION / ASSESSMENT AND PLAN / ED COURSE  Pertinent labs & imaging results that were available during my care of the patient were reviewed by me and considered in my medical decision making (see chart for details).   Patient with report of hematuria at home. Resolved here. UA negative. No STD exposure/concern. No urethral discharge or UTI symptoms. Plan for monitoring and PCP follow up with consideration of Urology referral if symptoms return.    ____________________________________________  FINAL CLINICAL IMPRESSION(S) / ED DIAGNOSES  Final diagnoses:  Hematuria, unspecified type    Note:  This document was prepared using Dragon voice recognition software and may include unintentional dictation errors.  Alona Bene, MD, [facility] Emergency Medicine    Aleksey Newbern, Arlyss Repress, MD 07/10/19 1946

## 2020-05-13 ENCOUNTER — Other Ambulatory Visit: Payer: Self-pay

## 2020-05-13 ENCOUNTER — Encounter (HOSPITAL_BASED_OUTPATIENT_CLINIC_OR_DEPARTMENT_OTHER): Payer: Self-pay

## 2020-05-13 ENCOUNTER — Emergency Department (HOSPITAL_BASED_OUTPATIENT_CLINIC_OR_DEPARTMENT_OTHER)
Admission: EM | Admit: 2020-05-13 | Discharge: 2020-05-13 | Disposition: A | Discharge status: Home / Self Care | Payer: Self-pay | Attending: Emergency Medicine | Admitting: Emergency Medicine

## 2020-05-13 DIAGNOSIS — F17290 Nicotine dependence, other tobacco product, uncomplicated: Secondary | ICD-10-CM | POA: Insufficient documentation

## 2020-05-13 DIAGNOSIS — S30860A Insect bite (nonvenomous) of lower back and pelvis, initial encounter: Secondary | ICD-10-CM | POA: Insufficient documentation

## 2020-05-13 DIAGNOSIS — W57XXXA Bitten or stung by nonvenomous insect and other nonvenomous arthropods, initial encounter: Secondary | ICD-10-CM | POA: Insufficient documentation

## 2020-05-13 MED ORDER — CEPHALEXIN 500 MG PO CAPS
500.0000 mg | ORAL_CAPSULE | Freq: Four times a day (QID) | ORAL | 0 refills | Status: AC
Start: 2020-05-13 — End: 2020-05-20

## 2020-05-13 MED ORDER — CEPHALEXIN 500 MG PO CAPS: 500.0000 mg | ORAL_CAPSULE | Freq: Two times a day (BID) | ORAL | 0 refills | Status: DC

## 2020-05-13 NOTE — ED Triage Notes (Addendum)
Pt c/o ?spider bite to left lower back x 3 days-swelling to left groin x today-NAD-steady gait

## 2020-05-13 NOTE — Discharge Instructions (Addendum)
I have prescribed antibiotics in order to help prevent further infection. Please take 1 tablet twice a day for the next 7 days.   If you experience any fever, abdominal pain, penile discharge or urinary symptoms please return to the emergency department.

## 2020-05-13 NOTE — ED Provider Notes (Signed)
[facility] EMERGENCY DEPARTMENT Provider Note   CSN: [id number] Arrival date & time: 05/13/20  1651     History Chief Complaint  Patient presents with  . Insect Bite    Ian Mercado is a 29 y.o. male.  29 y.o male with no PMH presents to the ED with a chief complaint of "insect bite" x 4 days. Patient reports noticing the bite then, this was pruritic in nature, grandmother placed ointment to it with mild improvement in symptoms. He reports the pain has worsen as the days passed. He has taken some motrin without improvement in his symptoms. He denies any penile discharge, urinary symptoms, flank pain.  Other complaints.  The history is provided by the patient.       History reviewed. No pertinent past medical history.  There are no problems to display for this patient.   History reviewed. No pertinent surgical history.     Family History  Problem Relation Age of Onset  . Diabetes Mother   . Hypertension Mother   . Diabetes Other   . Hypertension Other     Social History   Tobacco Use  . Smoking status: Current Some Day Smoker    Packs/day: 0.10    Years: 1.00    Pack years: 0.10    Types: Cigars  . Smokeless tobacco: Never Used  Vaping Use  . Vaping Use: Never used  Substance Use Topics  . Alcohol use: No  . Drug use: No    Home Medications Prior to Admission medications   Medication Sig Start Date End Date Taking? Authorizing Provider  cephALEXin (KEFLEX) 500 MG capsule Take 1 capsule (500 mg total) by mouth 4 (four) times daily for 7 days. 05/13/20 05/20/20  Claude Manges, PA-C    Allergies    Patient has no known allergies.  Review of Systems   Review of Systems  Constitutional: Negative for fever.  Skin: Positive for wound.    Physical Exam Updated Vital Signs BP 120/69 (BP Location: Left Arm)   Pulse 90   Temp 99.2 F (37.3 C) (Oral)   Resp 18   Ht 6' (1.829 m)   Wt 67.6 kg   SpO2 100%   BMI 20.21 kg/m   Physical  Exam Vitals and nursing note reviewed.  Constitutional:      Appearance: Normal appearance.  HENT:     Mouth/Throat:     Mouth: Mucous membranes are moist.  Pulmonary:     Effort: Pulmonary effort is normal.     Breath sounds: No wheezing.  Abdominal:     General: Abdomen is flat.     Tenderness: There is no abdominal tenderness.  Musculoskeletal:     Cervical back: Normal range of motion and neck supple.  Lymphadenopathy:     Lower Body: Left inguinal adenopathy present.  Skin:    General: Skin is warm.     Findings: Erythema and wound present. No abscess.          Comments: Small insect bite with scabbing and surrounding erythema.   Neurological:     Mental Status: He is alert and oriented to person, place, and time.     ED Results / Procedures / Treatments   Labs (all labs ordered are listed, but only abnormal results are displayed) Labs Reviewed - No data to display  EKG None  Radiology No results found.  Procedures Procedures (including critical care time)  Medications Ordered in ED Medications - No data to display  ED Course  I have reviewed the triage vital signs and the nursing notes.  Pertinent labs & imaging results that were available during my care of the patient were reviewed by me and considered in my medical decision making (see chart for details).    MDM Rules/Calculators/A&P    Patient with no past medical history presents to the ED with complaints of insect bite, this occurred 4 days ago.  Has been applying over-the-counter measurements with some improvement.  Does report is very painful, has been taking Motrin for the pain.  He also reports some left adenopathy, palpable on my exam.  Denies any urinary symptoms, sexually active but reports using condoms, no penile discharge, no abdominal pain, no concerns for sexually transmitted infections.  Discussed prophylactic treatment of insect bite with antibiotics to further prevent cellulitis at  this time.  There is no streaking in the skin, although does have some surrounding erythema, no fluctuance noted for I&D at this time.  Patient is agreeable to treatment with antibiotics. Discussed close follow-up with PCP or return to the ED for worsening symptoms.   Portions of this note were generated with Scientist, clinical (histocompatibility and immunogenetics). Dictation errors may occur despite best attempts at proofreading.  Final Clinical Impression(s) / ED Diagnoses Final diagnoses:  Insect bite of lower back, initial encounter    Rx / DC Orders ED Discharge Orders         Ordered    cephALEXin (KEFLEX) 500 MG capsule  2 times daily,   Status:  Discontinued        05/13/20 1736    cephALEXin (KEFLEX) 500 MG capsule  4 times daily        05/13/20 1738           Claude Manges, PA-C 05/13/20 1752    Melene Plan, DO 05/13/20 2100

## 2020-05-13 NOTE — ED Notes (Signed)
Review D/C papers with pt, pt states understanding, pt denies questions at this time. 

## 2021-06-07 ENCOUNTER — Emergency Department (HOSPITAL_BASED_OUTPATIENT_CLINIC_OR_DEPARTMENT_OTHER): Payer: Self-pay

## 2021-06-07 ENCOUNTER — Emergency Department (HOSPITAL_BASED_OUTPATIENT_CLINIC_OR_DEPARTMENT_OTHER)
Admission: EM | Admit: 2021-06-07 | Discharge: 2021-06-07 | Disposition: A | Discharge status: Home / Self Care | Payer: Self-pay | Attending: Emergency Medicine | Admitting: Emergency Medicine

## 2021-06-07 ENCOUNTER — Encounter (HOSPITAL_BASED_OUTPATIENT_CLINIC_OR_DEPARTMENT_OTHER): Payer: Self-pay | Admitting: Emergency Medicine

## 2021-06-07 ENCOUNTER — Other Ambulatory Visit: Payer: Self-pay

## 2021-06-07 DIAGNOSIS — R1032 Left lower quadrant pain: Secondary | ICD-10-CM | POA: Insufficient documentation

## 2021-06-07 DIAGNOSIS — R310 Gross hematuria: Secondary | ICD-10-CM | POA: Insufficient documentation

## 2021-06-07 LAB — URINALYSIS, ROUTINE W REFLEX MICROSCOPIC
Bilirubin Urine: NEGATIVE
Glucose, UA: NEGATIVE mg/dL
Ketones, ur: NEGATIVE mg/dL
Leukocytes,Ua: NEGATIVE
Nitrite: NEGATIVE
Protein, ur: NEGATIVE mg/dL
Specific Gravity, Urine: >=1.03 (ref 1.005–1.030)
pH: 6 (ref 5.0–8.0)

## 2021-06-07 LAB — CBC WITH DIFFERENTIAL/PLATELET
Abs Immature Granulocytes: 0.03 10*3/uL (ref 0.00–0.07)
Basophils Absolute: 0.1 10*3/uL (ref 0.0–0.1)
Basophils Relative: 1 %
Eosinophils Absolute: 0.2 10*3/uL (ref 0.0–0.5)
Eosinophils Relative: 3 %
HCT: 40.3 % (ref 39.0–52.0)
Hemoglobin: 13.8 g/dL (ref 13.0–17.0)
Immature Granulocytes: 0 %
Lymphocytes Relative: 37 %
Lymphs Abs: 2.7 10*3/uL (ref 0.7–4.0)
MCH: 31.9 pg (ref 26.0–34.0)
MCHC: 34.2 g/dL (ref 30.0–36.0)
MCV: 93.1 fL (ref 80.0–100.0)
Monocytes Absolute: 0.6 10*3/uL (ref 0.1–1.0)
Monocytes Relative: 9 %
Neutro Abs: 3.6 10*3/uL (ref 1.7–7.7)
Neutrophils Relative %: 50 %
Platelets: 212 10*3/uL (ref 150–400)
RBC: 4.33 MIL/uL (ref 4.22–5.81)
RDW: 12.5 % (ref 11.5–15.5)
WBC: 7.2 10*3/uL (ref 4.0–10.5)
nRBC: 0 % (ref 0.0–0.2)

## 2021-06-07 LAB — BASIC METABOLIC PANEL
Anion gap: 6 (ref 5–15)
BUN: 14 mg/dL (ref 6–20)
CO2: 23 mmol/L (ref 22–32)
Calcium: 8.9 mg/dL (ref 8.9–10.3)
Chloride: 108 mmol/L (ref 98–111)
Creatinine, Ser: 1.03 mg/dL (ref 0.61–1.24)
GFR, Estimated: >60 mL/min (ref >60)
Glucose, Bld: 116 mg/dL — ABNORMAL HIGH (ref 70–99)
Potassium: 3.5 mmol/L (ref 3.5–5.1)
Sodium: 137 mmol/L (ref 135–145)

## 2021-06-07 LAB — URINALYSIS, MICROSCOPIC (REFLEX)

## 2021-06-07 LAB — CK: Total CK: 141 U/L (ref 49–397)

## 2021-06-07 NOTE — Discharge Instructions (Signed)
Testing does not show any evidence of infection or kidney stone.  Follow-up with the urologist for further evaluation of the blood in your urine.  Return to the ED with worsening symptoms.

## 2021-06-07 NOTE — ED Triage Notes (Signed)
Reports blood in his urine tonight.  Denies any other symptoms.  Does endorse low back pain a week ago.

## 2021-06-07 NOTE — ED Provider Notes (Signed)
[facility] EMERGENCY DEPARTMENT Provider Note   CSN: [id number] Arrival date & time: 06/07/21  0125     History  Chief Complaint  Patient presents with   Hematuria    Ian Mercado is a 31 y.o. male.  Patient with painless gross hematuria around 11 PM.  States everything was normal before this.  Happened 1 time.  Has urinated since and does not appear bloody any longer.  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  Admits to some left lower quadrant abdominal soreness since arriving to the hospital.  Did not realize he had this until his abdomen was examined.  No fevers or vomiting.  No testicular pain or swelling.  No bleeding from penis itself. No history of kidney stones.  No back pain currently.  No chest pain or shortness of breath No STD exposure or concern.   The history is provided by the patient.  Hematuria Associated symptoms include abdominal pain. Pertinent negatives include no chest pain, no headaches and no shortness of breath.      Home Medications Prior to Admission medications   Not on File      Allergies    Patient has no known allergies.    Review of Systems   Review of Systems  Constitutional:  Negative for activity change, appetite change and fever.  HENT:  Negative for congestion and rhinorrhea.   Respiratory:  Negative for chest tightness and shortness of breath.   Cardiovascular:  Negative for chest pain.  Gastrointestinal:  Positive for abdominal pain. Negative for nausea and vomiting.  Genitourinary:  Positive for hematuria. Negative for dysuria.  Musculoskeletal:  Positive for back pain.  Skin:  Negative for rash.  Neurological:  Negative for dizziness, weakness, light-headedness and headaches.   all other systems are negative except as noted in the HPI and PMH.   Physical Exam Updated Vital Signs BP 118/78    Pulse 73    Ht 6' (1.829  m)    Wt 67.6 kg    SpO2 98%    BMI 20.21 kg/m  Physical Exam Vitals and nursing note reviewed.  Constitutional:      General: He is not in acute distress.    Appearance: He is well-developed.  HENT:     Head: Normocephalic and atraumatic.     Mouth/Throat:     Pharynx: No oropharyngeal exudate.  Eyes:     Conjunctiva/sclera: Conjunctivae normal.     Pupils: Pupils are equal, round, and reactive to light.  Neck:     Comments: No meningismus. Cardiovascular:     Rate and Rhythm: Normal rate and regular rhythm.     Heart sounds: Normal heart sounds. No murmur heard. Pulmonary:     Effort: Pulmonary effort is normal. No respiratory distress.     Breath sounds: Normal breath sounds.  Abdominal:     Palpations: Abdomen is soft.     Tenderness: There is abdominal tenderness. There is no guarding or rebound.     Comments: Minimal LLQ. No guarding or rebound  Genitourinary:    Comments: No testicular tenderness Musculoskeletal:        General: No tenderness. Normal range of motion.     Cervical back: Normal range of motion and neck supple.     Comments: No CVAT  Skin:    General: Skin is warm.     Capillary Refill: Capillary  refill takes less than 2 seconds.  Neurological:     General: No focal deficit present.     Mental Status: He is alert and oriented to person, place, and time. Mental status is at baseline.     Cranial Nerves: No cranial nerve deficit.     Motor: No abnormal muscle tone.     Coordination: Coordination normal.     Comments:  5/5 strength throughout. CN 2-12 intact.Equal grip strength.   Psychiatric:        Behavior: Behavior normal.    ED Results / Procedures / Treatments   Labs (all labs ordered are listed, but only abnormal results are displayed) Labs Reviewed  URINALYSIS, ROUTINE W REFLEX MICROSCOPIC - Abnormal; Notable for the following components:      Result Value   Hgb urine dipstick LARGE (*)    All other components within normal limits  BASIC  METABOLIC PANEL - Abnormal; Notable for the following components:   Glucose, Bld 116 (*)    All other components within normal limits  URINALYSIS, MICROSCOPIC (REFLEX) - Abnormal; Notable for the following components:   Bacteria, UA FEW (*)    All other components within normal limits  CBC WITH DIFFERENTIAL/PLATELET  CK    EKG None  Radiology CT Renal Stone Study  Result Date: 06/07/2021 CLINICAL DATA:  Flank pain. EXAM: CT ABDOMEN AND PELVIS WITHOUT CONTRAST TECHNIQUE: Multidetector CT imaging of the abdomen and pelvis was performed following the standard protocol without IV contrast. COMPARISON:  None. FINDINGS: Evaluation of this exam is limited in the absence of intravenous contrast. Lower chest: The visualized lung bases are clear. No intra-abdominal free air or free fluid. Hepatobiliary: No focal liver abnormality is seen. No gallstones, gallbladder wall thickening, or biliary dilatation. Pancreas: Unremarkable. No pancreatic ductal dilatation or surrounding inflammatory changes. Spleen: Normal in size without focal abnormality. Adrenals/Urinary Tract: The adrenal glands unremarkable the kidneys, visualized ureters, and urinary bladder appear unremarkable. Stomach/Bowel: There is no bowel obstruction or active inflammation. The appendix is unremarkable as visualized. Vascular/Lymphatic: The abdominal aorta and IVC unremarkable. No portal venous gas. There is no adenopathy. Reproductive: The prostate and seminal vesicles are grossly unremarkable no pelvic mass. Other: None Musculoskeletal: No acute or significant osseous findings. IMPRESSION: No acute intra-abdominal or pelvic pathology. No hydronephrosis or nephrolithiasis. Electronically Signed   By: Anner Crete M.D.   On: 06/07/2021 02:06    Procedures Procedures    Medications Ordered in ED Medications - No data to display  ED Course/ Medical Decision Making/ A&P                           Medical Decision Making Painless  hematuria.  Mild left lower quadrant discomfort. Vitals stable. No distress.  Urinalysis shows gross hematuria.  Negative leukocytes and negative nitrite.  Consider possible passed kidney stone.  CT obtained to evaluate and rule out mass or other obstruction. CT scan reassuring.  No masses or obstructive uropathy. Creatinine and CK are normal.  Patient feels well and has no symptoms currently.  Suspect he may have passed a kidney stone. No STD concern.  If he has recurrent hematuria he will need to follow-up with urology for consideration of cystoscopy. Return precautions discussed        Final Clinical Impression(s) / ED Diagnoses Final diagnoses:  Gross hematuria    Rx / DC Orders ED Discharge Orders     None  Ezequiel Essex, MD 06/07/21 [phone]

## 2021-06-08 LAB — URINE CULTURE: Culture: NO GROWTH

## 2021-06-08 LAB — GC/CHLAMYDIA PROBE AMP (~~LOC~~) NOT AT ARMC
Chlamydia: NEGATIVE
Comment: NEGATIVE
Comment: NORMAL
Neisseria Gonorrhea: NEGATIVE

## 2022-02-13 ENCOUNTER — Other Ambulatory Visit: Payer: Self-pay

## 2022-02-13 ENCOUNTER — Encounter (HOSPITAL_BASED_OUTPATIENT_CLINIC_OR_DEPARTMENT_OTHER): Payer: Self-pay | Admitting: Emergency Medicine

## 2022-02-13 ENCOUNTER — Emergency Department (HOSPITAL_BASED_OUTPATIENT_CLINIC_OR_DEPARTMENT_OTHER)
Admission: EM | Admit: 2022-02-13 | Discharge: 2022-02-13 | Disposition: A | Discharge status: Home / Self Care | Payer: Self-pay | Attending: Emergency Medicine | Admitting: Emergency Medicine

## 2022-02-13 DIAGNOSIS — R059 Cough, unspecified: Secondary | ICD-10-CM | POA: Insufficient documentation

## 2022-02-13 DIAGNOSIS — R0981 Nasal congestion: Secondary | ICD-10-CM | POA: Insufficient documentation

## 2022-02-13 DIAGNOSIS — N3001 Acute cystitis with hematuria: Secondary | ICD-10-CM | POA: Insufficient documentation

## 2022-02-13 DIAGNOSIS — Z20822 Contact with and (suspected) exposure to covid-19: Secondary | ICD-10-CM | POA: Insufficient documentation

## 2022-02-13 DIAGNOSIS — Z711 Person with feared health complaint in whom no diagnosis is made: Secondary | ICD-10-CM

## 2022-02-13 DIAGNOSIS — Z202 Contact with and (suspected) exposure to infections with a predominantly sexual mode of transmission: Secondary | ICD-10-CM | POA: Insufficient documentation

## 2022-02-13 DIAGNOSIS — J3489 Other specified disorders of nose and nasal sinuses: Secondary | ICD-10-CM | POA: Insufficient documentation

## 2022-02-13 LAB — URINALYSIS, MICROSCOPIC (REFLEX)

## 2022-02-13 LAB — URINALYSIS, ROUTINE W REFLEX MICROSCOPIC
Bilirubin Urine: NEGATIVE
Glucose, UA: NEGATIVE mg/dL
Ketones, ur: NEGATIVE mg/dL
Nitrite: NEGATIVE
Protein, ur: 30 mg/dL — AB
Specific Gravity, Urine: >=1.03 (ref 1.005–1.030)
pH: 6 (ref 5.0–8.0)

## 2022-02-13 LAB — RESP PANEL BY RT-PCR (FLU A&B, COVID) ARPGX2
Influenza A by PCR: NEGATIVE
Influenza B by PCR: NEGATIVE
SARS Coronavirus 2 by RT PCR: NEGATIVE

## 2022-02-13 MED ORDER — CEPHALEXIN 500 MG PO CAPS: 500.0000 mg | ORAL_CAPSULE | Freq: Four times a day (QID) | ORAL | 0 refills | Status: AC

## 2022-02-13 NOTE — ED Provider Notes (Signed)
[facility] EMERGENCY DEPARTMENT Provider Note   CSN: [id number] Arrival date & time: 02/13/22  1949     History  Chief Complaint  Patient presents with   Cough    Ian Mercado is a 31 y.o. male.  Who presents to the ED for evaluation of 7 days of nasal congestion, rhinorrhea, cough.  He reports cough is productive of clear sputum.  States symptoms have progressively gotten better, but would like to be checked for COVID today.  States he did take a Claritin earlier today for his symptoms and believes it made him drowsy.  Also reports concern for STD.  Would like to be checked today.  States he had a "weird feeling "when he was urinating earlier today, denies dysuria, urgency, urgency, penile or pubic lesions, Penile discharge.  Denies chest pain, shortness of breath, fevers, chills.   Cough Associated symptoms: rhinorrhea        Home Medications Prior to Admission medications   Medication Sig Start Date End Date Taking? Authorizing Provider  cephALEXin (KEFLEX) 500 MG capsule Take 1 capsule (500 mg total) by mouth 4 (four) times daily for 7 days. 02/13/22 02/20/22 Yes Jenniger Figiel, [place] Folk, PA-C      Allergies    Patient has no known allergies.    Review of Systems   Review of Systems  HENT:  Positive for congestion and rhinorrhea.   Respiratory:  Positive for cough.   All other systems reviewed and are negative.   Physical Exam Updated Vital Signs BP 125/77   Pulse 69   Temp 98.4 F (36.9 C) (Oral)   Resp 15   Ht 6' (1.829 m)   Wt 68 kg   SpO2 99%   BMI 20.34 kg/m  Physical Exam Vitals and nursing note reviewed. Exam conducted with a chaperone present Evelena Peat, Therapist, sports).  Constitutional:      General: He is not in acute distress.    Appearance: Normal appearance. He is normal weight. He is not ill-appearing.  HENT:     Head: Normocephalic and atraumatic.     Mouth/Throat:     Mouth: Mucous membranes are moist.     Pharynx: Oropharynx is clear. No  oropharyngeal exudate or posterior oropharyngeal erythema.  Eyes:     Pupils: Pupils are equal, round, and reactive to light.  Cardiovascular:     Rate and Rhythm: Normal rate and regular rhythm.     Pulses: Normal pulses.     Heart sounds: Normal heart sounds.  Pulmonary:     Effort: Pulmonary effort is normal. No respiratory distress.     Breath sounds: No stridor. No wheezing, rhonchi or rales.  Abdominal:     General: Abdomen is flat.     Hernia: There is no hernia in the left inguinal area or right inguinal area.  Genitourinary:    Pubic Area: No rash.      Penis: Normal and circumcised. No erythema, discharge or lesions.      Testes: Normal.     Comments: GU exam unremarkable Musculoskeletal:        General: Normal range of motion.     Cervical back: Neck supple.  Lymphadenopathy:     Lower Body: No right inguinal adenopathy. No left inguinal adenopathy.  Skin:    General: Skin is warm and dry.  Neurological:     Mental Status: He is alert and oriented to person, place, and time.  Psychiatric:        Mood and Affect: Mood  normal.        Behavior: Behavior normal.     ED Results / Procedures / Treatments   Labs (all labs ordered are listed, but only abnormal results are displayed) Labs Reviewed  URINALYSIS, ROUTINE W REFLEX MICROSCOPIC - Abnormal; Notable for the following components:      Result Value   Hgb urine dipstick TRACE (*)    Protein, ur 30 (*)    Leukocytes,Ua TRACE (*)    All other components within normal limits  URINALYSIS, MICROSCOPIC (REFLEX) - Abnormal; Notable for the following components:   Bacteria, UA MANY (*)    Non Squamous Epithelial PRESENT (*)    All other components within normal limits  RESP PANEL BY RT-PCR (FLU A&B, COVID) ARPGX2  RPR  HIV ANTIBODY (ROUTINE TESTING W REFLEX)  GC/CHLAMYDIA PROBE AMP ([place]) NOT AT [facility]    EKG None  Radiology No results found.  Procedures Procedures    Medications Ordered in  ED Medications - No data to display  ED Course/ Medical Decision Making/ A&P Clinical Course as of 02/13/22 2221  Wynelle Link Feb 13, 2022  2124 Urinalysis, Routine w reflex microscopic Anterior Nasal Swab [AS]    Clinical Course User Index [AS] Lula Olszewski Edsel Petrin, PA-C                           Medical Decision Making Amount and/or Complexity of Data Reviewed Labs: ordered. Decision-making details documented in ED Course.  Risk Prescription drug management.  This patient presents to the ED for concern of  Rhinorrhea, nasal congestion, "weird feeling while urinating " The differential diagnosis includes COVID, flu, viral URI, STI, UTI  My initial workup includes respiratory panel, urinalysis, STI panel  Additional history obtained from: Nursing notes from this visit.  I ordered, reviewed and interpreted labs which include: Urinalysis.  Did show signs of UTI.  Also ordered urine chlamydia and gonorrhea as well as HIV and RPR.  Did tell patient that these test take up to 48 hrs to be resulted and that he may check his results on his MyChart.   Afebrile, hemodynamically stable.  Urinalysis with signs of UTI.  We will treat this with Keflex.  Respiratory panel negative.  Patient taking Claritin at home for rhinorrhea and nasal congestion.  Declined Flonase prescription.  I did inform patient that STI testing takes up to 48 hours for results and that he may check his results and contract.  Also informed him that the department may contact him if he does have a positive test.  Stable at the time of discharge.  At this time there does not appear to be any evidence of an acute emergency medical condition and the patient appears stable for discharge with appropriate outpatient follow up. Diagnosis was discussed with patient who verbalizes understanding of care plan and is agreeable to discharge. I have discussed return precautions with patient who verbalizes understanding. Patient encouraged to  follow-up with their PCP within 1 week. All questions answered.  Patient's case discussed with Dr. Deretha Emory who agrees with plan to discharge with follow-up.   Note: Portions of this report may have been transcribed using voice recognition software. Every effort was made to ensure accuracy; however, inadvertent computerized transcription errors may still be present.         Final Clinical Impression(s) / ED Diagnoses Final diagnoses:  Acute cystitis with hematuria  Concern about STD in male without diagnosis    Rx /  DC Orders ED Discharge Orders          Ordered    cephALEXin (KEFLEX) 500 MG capsule  4 times daily        02/13/22 2157              Mora Bellman 02/13/22 2231    Vanetta Mulders, MD 02/16/22 [phone]

## 2022-02-13 NOTE — ED Triage Notes (Signed)
Patient states he would like a COVID test and an STD check. Patient reports he has had a cough and congestion since last week. Patient denies any symptoms or issues that concern him for STD's at this time.

## 2022-02-13 NOTE — ED Notes (Signed)
D/c paperwork reviewed with pt, including prescription. All questions addressed prior to d/c. Pt ambulatory to ED exit without assistance, NAD.

## 2022-02-13 NOTE — Discharge Instructions (Addendum)
You have been seen today for your complaint of cough, rhinorrhea, concern for STD. Your lab work showed that you have UTI. Your discharge medications include Keflex.  This is an antibiotic.  You should take it as prescribed.  You should take for the entire duration of the prescription.  This may cause some abdominal pain.  This is normal.  You may take it with food. Follow up with: Your primary care provider next week Please seek immediate medical care if you develop any of the following symptoms: You have severe pain in your back or your lower abdomen. You have a fever or chills. You have nausea or vomiting. At this time there does not appear to be the presence of an emergent medical condition, however there is always the potential for conditions to change. Please read and follow the below instructions.  Do not take your medicine if  develop an itchy rash, swelling in your mouth or lips, or difficulty breathing; call 911 and seek immediate emergency medical attention if this occurs.  You may review your lab tests and imaging results in their entirety on your MyChart account.  Please discuss all results of fully with your primary care provider and other specialist at your follow-up visit.  Note: Portions of this text may have been transcribed using voice recognition software. Every effort was made to ensure accuracy; however, inadvertent computerized transcription errors may still be present.

## 2022-02-14 LAB — HIV ANTIBODY (ROUTINE TESTING W REFLEX): HIV Screen 4th Generation wRfx: NONREACTIVE

## 2022-02-14 LAB — RPR: RPR Ser Ql: NONREACTIVE

## 2022-06-26 IMAGING — CT CT RENAL STONE PROTOCOL
2 of 4 series · 16 of 46 positions shown, 18 images · non-contrast
Comparison: None.

CLINICAL DATA: Flank pain.

EXAM:
CT ABDOMEN AND PELVIS WITHOUT CONTRAST
TECHNIQUE: Multidetector CT imaging of the abdomen and pelvis was performed
following the standard protocol without IV contrast.

[Series 2: axial st · axial · 0.74mm/px · z∈[-466,-101]mm · 13 of 81 slices shown, 15 images]
[im 4/81  soft-tissue]
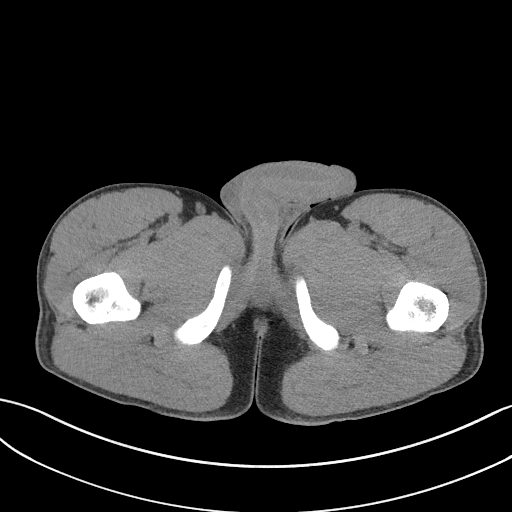
[im 4/81  bone]
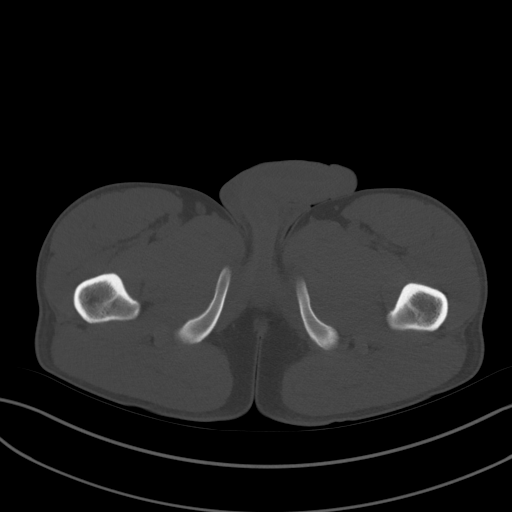
[im 11/81  soft-tissue]
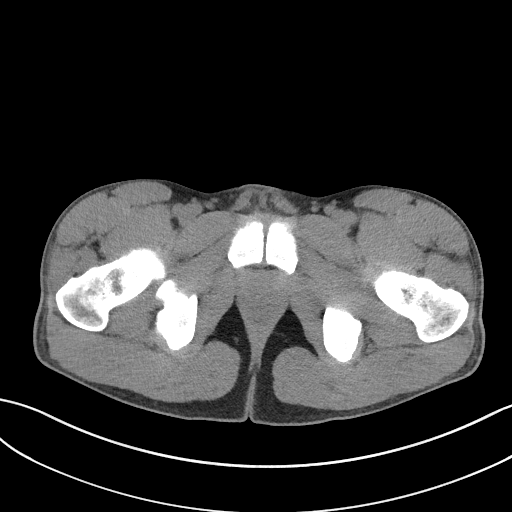
[im 17/81  soft-tissue]
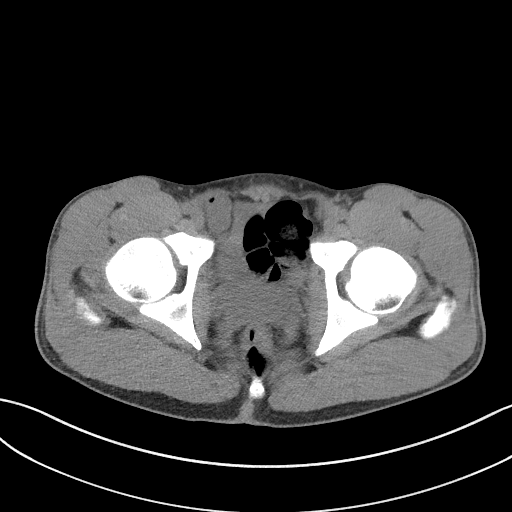
[im 24/81  soft-tissue]
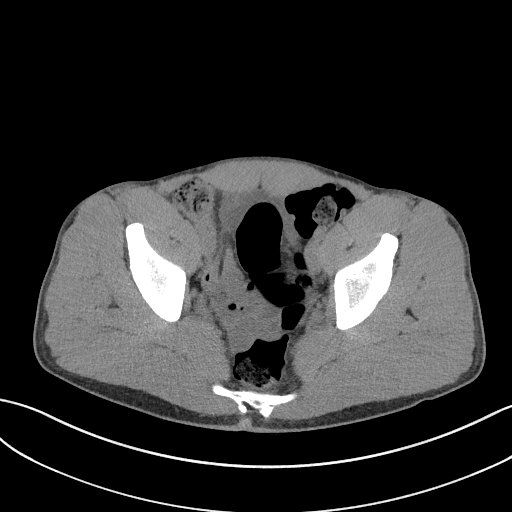
[im 27/81  soft-tissue]
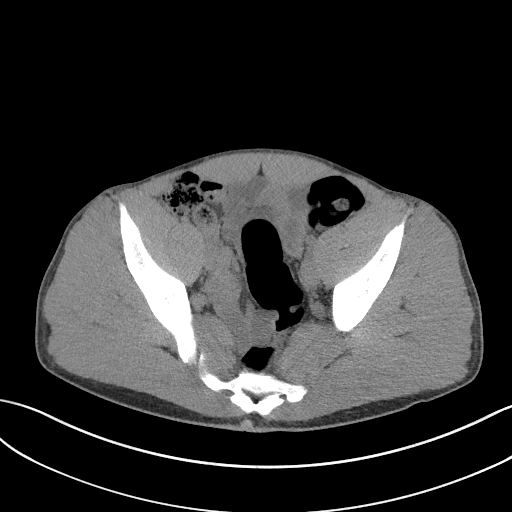
[im 34/81  soft-tissue]
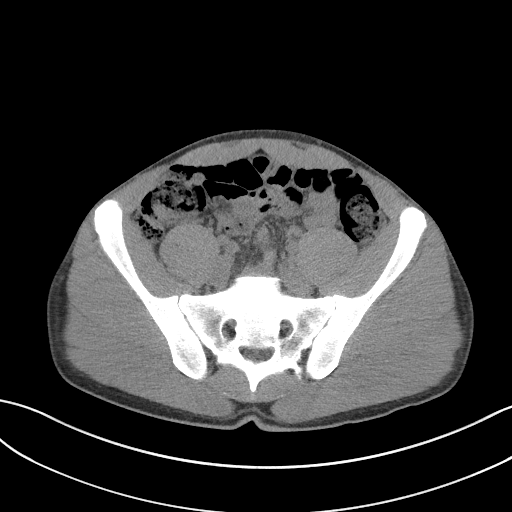
[im 41/81  soft-tissue]
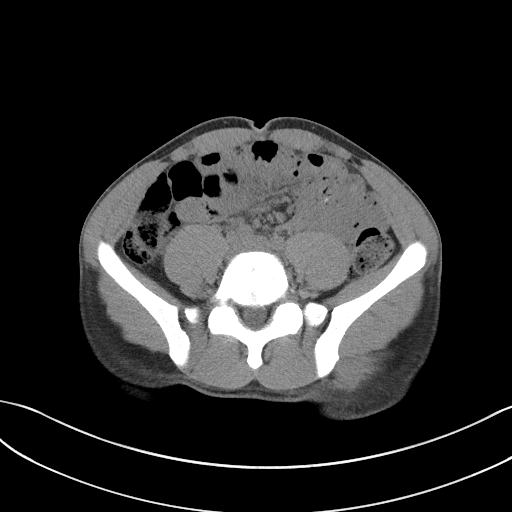
[im 47/81  soft-tissue]
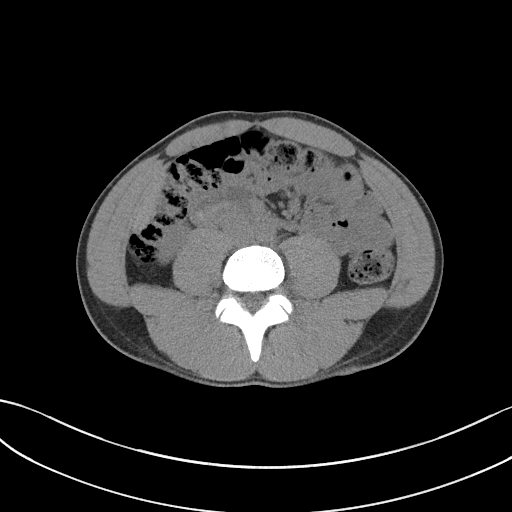
[im 54/81  soft-tissue]
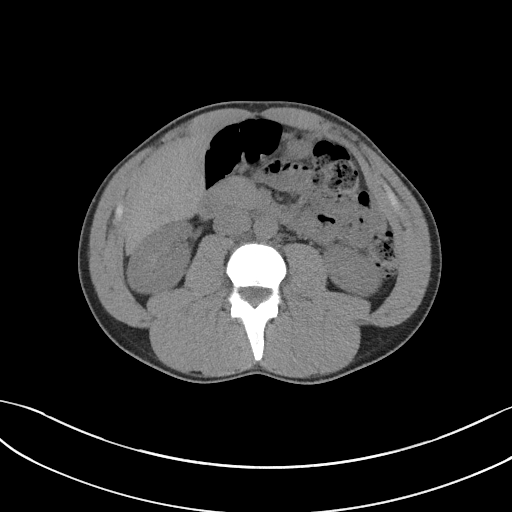
[im 54/81  bone]
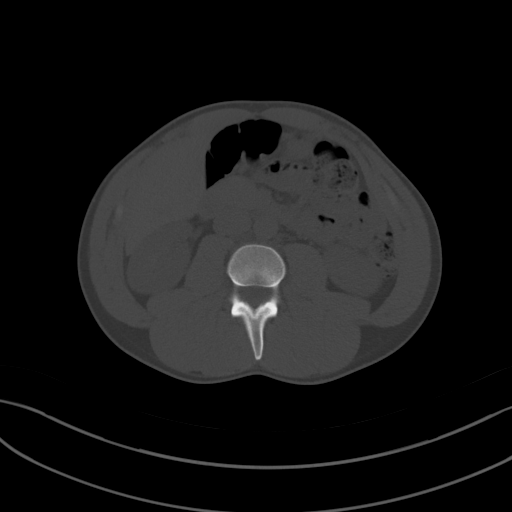
[im 57/81  soft-tissue]
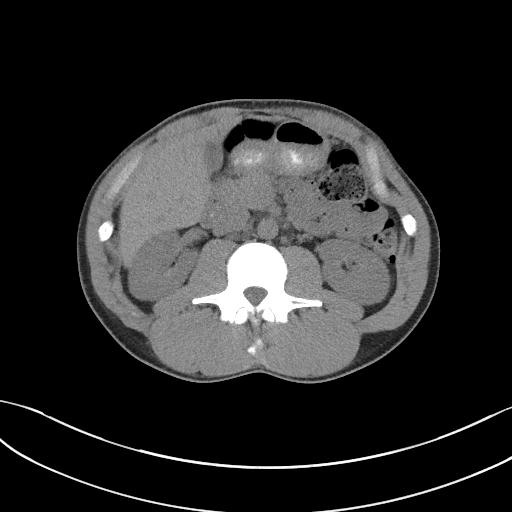
[im 64/81  soft-tissue]
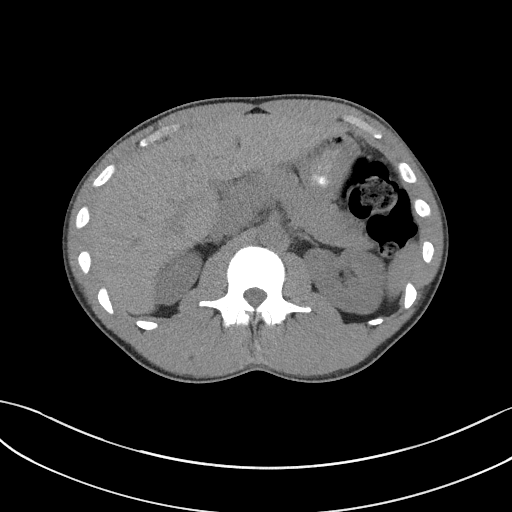
[im 71/81  soft-tissue]
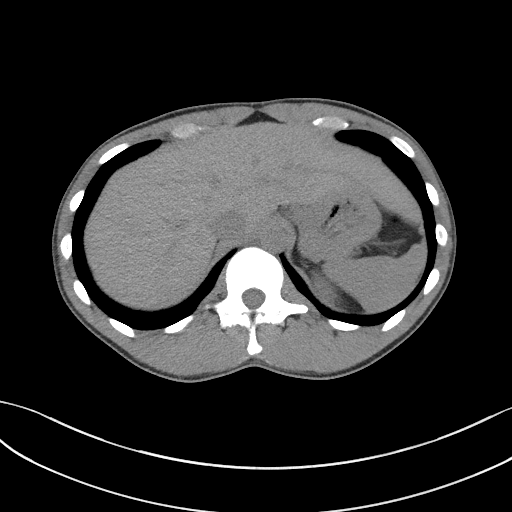
[im 77/81  soft-tissue]
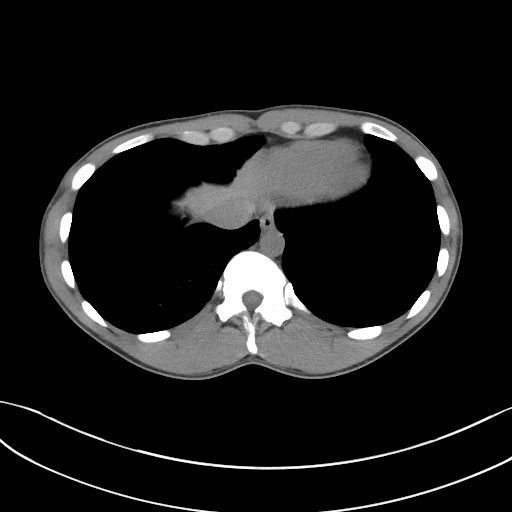

[Series 4: coronal st · coronal · 0.79mm/px · 3 of 78 slices shown]
[im 26/78  soft-tissue]
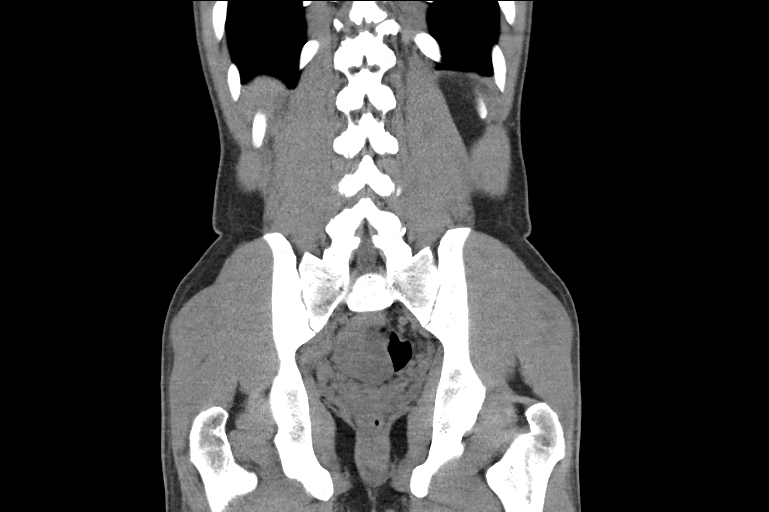
[im 35/78  soft-tissue]
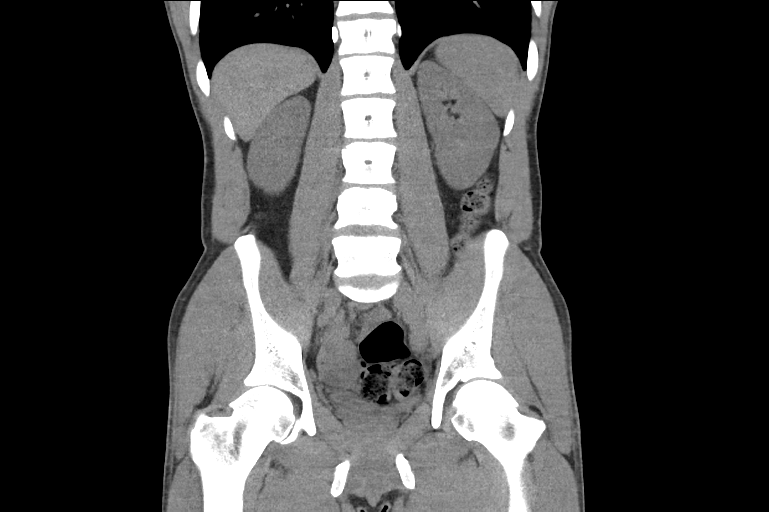
[im 43/78  soft-tissue]
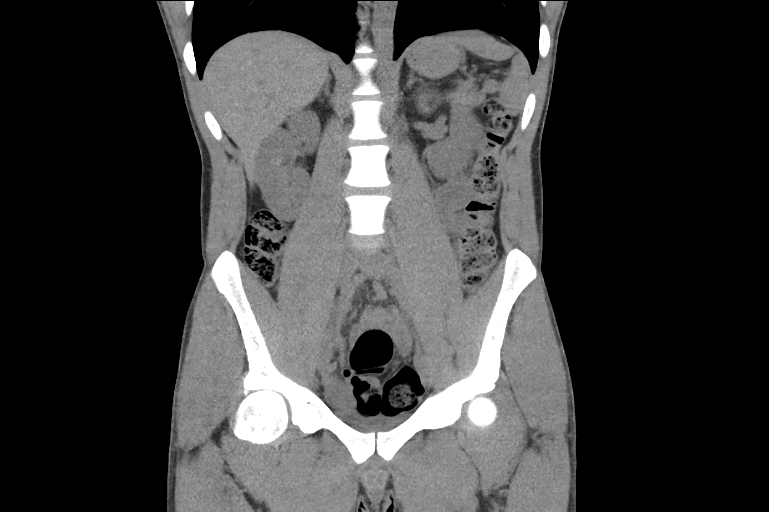

[16 of 46 positions shown; findings below may reference images not displayed]

FINDINGS: Evaluation of this exam is limited in the absence of intravenous
contrast.

Lower chest: The visualized lung bases are clear.

No intra-abdominal free air or free fluid.

Hepatobiliary: No focal liver abnormality is seen. No gallstones,
gallbladder wall thickening, or biliary dilatation.

Pancreas: Unremarkable. No pancreatic ductal dilatation or
surrounding inflammatory changes.

Spleen: Normal in size without focal abnormality.

Adrenals/Urinary Tract: The adrenal glands unremarkable the kidneys,
visualized ureters, and urinary bladder appear unremarkable.

Stomach/Bowel: There is no bowel obstruction or active inflammation.
The appendix is unremarkable as visualized.

Vascular/Lymphatic: The abdominal aorta and IVC unremarkable. No
portal venous gas. There is no adenopathy.

Reproductive: The prostate and seminal vesicles are grossly
unremarkable no pelvic mass.

Other: None

Musculoskeletal: No acute or significant osseous findings.
IMPRESSION: No acute intra-abdominal or pelvic pathology. No hydronephrosis or
nephrolithiasis.

## 2022-07-02 ENCOUNTER — Other Ambulatory Visit: Payer: Self-pay

## 2022-07-02 ENCOUNTER — Emergency Department (HOSPITAL_BASED_OUTPATIENT_CLINIC_OR_DEPARTMENT_OTHER)
Admission: EM | Admit: 2022-07-02 | Discharge: 2022-07-02 | Disposition: A | Discharge status: Home / Self Care | Payer: BLUE CROSS/BLUE SHIELD | Attending: Emergency Medicine | Admitting: Emergency Medicine

## 2022-07-02 ENCOUNTER — Emergency Department (HOSPITAL_BASED_OUTPATIENT_CLINIC_OR_DEPARTMENT_OTHER): Payer: BLUE CROSS/BLUE SHIELD

## 2022-07-02 DIAGNOSIS — R0789 Other chest pain: Secondary | ICD-10-CM | POA: Diagnosis present

## 2022-07-02 DIAGNOSIS — R252 Cramp and spasm: Secondary | ICD-10-CM | POA: Insufficient documentation

## 2022-07-02 MED ORDER — KETOROLAC TROMETHAMINE 15 MG/ML IJ SOLN
15.0000 mg | Freq: Once | INTRAMUSCULAR | Status: AC
Start: 2022-07-02 — End: 2022-07-02
  Administered 2022-07-02: 15 mg via INTRAMUSCULAR
  Filled 2022-07-02: qty 1

## 2022-07-02 MED ORDER — METHOCARBAMOL 500 MG PO TABS: 1000.0000 mg | ORAL_TABLET | Freq: Three times a day (TID) | ORAL | 0 refills | Status: AC | PRN

## 2022-07-02 MED ORDER — MELOXICAM 7.5 MG PO TABS: 7.5000 mg | ORAL_TABLET | Freq: Every day | ORAL | 0 refills | Status: AC

## 2022-07-02 NOTE — ED Triage Notes (Signed)
Pt here for L rib cage muscle spasms. Pt stated he was at work in Dec and felt a pop, he has had muscle pain on L mid back and on L side of rib cage since, but began having spasms this week. PT has taken advil w/ minimal relief. Pt denies sob, cp.

## 2022-07-02 NOTE — Discharge Instructions (Signed)
Please read and follow all provided instructions.  Your diagnoses today include:  1. Chest wall pain    Tests performed today include: X-ray of the chest and ribs: No concerning findings today Vital signs. See below for your results today.   Medications prescribed:  Meloxicam - anti-inflammatory pain medication  You have been prescribed an anti-inflammatory medication or NSAID. Take with food. Do not take aspirin, ibuprofen, or naproxen if taking this medication. Take smallest effective dose for the shortest duration needed for your pain. Stop taking if you experience stomach pain or vomiting.   Robaxin (methocarbamol) - muscle relaxer medication  DO NOT drive or perform any activities that require you to be awake and alert because this medicine can make you drowsy.   Take any prescribed medications only as directed.  Home care instructions:  Follow any educational materials contained in this packet.  BE VERY CAREFUL not to take multiple medicines containing Tylenol (also called acetaminophen). Doing so can lead to an overdose which can damage your liver and cause liver failure and possibly death.   Follow-up instructions: Please follow-up with your primary care provider in the next 3 days for further evaluation of your symptoms.   Return instructions:  Please return to the Emergency Department if you experience worsening symptoms.  Please return if you have any other emergent concerns.  Additional Information:  Your vital signs today were: BP 113/60 (BP Location: Right Arm)   Pulse 78   Temp 97.7 F (36.5 C)   Resp 18   Ht 6' (1.829 m)   Wt 69.4 kg   SpO2 100%   BMI 20.75 kg/m  If your blood pressure (BP) was elevated above 135/85 this visit, please have this repeated by your doctor within one month. --------------

## 2022-07-02 NOTE — ED Provider Notes (Signed)
[facility] Provider Note   CSN: [id number] Arrival date & time: 07/02/22  2050     History  Chief Complaint  Patient presents with   Spasms    Ian Mercado is a 32 y.o. male.  Patient presents for evaluation of left-sided rib pain starting approximate 1 month ago.  Patient states that he reached down to take something out of his truck when he developed a sharp "nerve pain" which made it difficult for him to sit upright and walk for about an hour.  He took some ibuprofen which helped the symptoms however it has been recurrent over the past month.  It is making it difficult to work, he works on [place].  He does state that the pain is migratory currently more towards the back than the flank.  No difficulty breathing, cough, shortness of breath.  Pain is generally worse with movement, not much with palpation.  No lower extremity swelling or pain.       Home Medications Prior to Admission medications   Not on File      Allergies    Patient has no known allergies.    Review of Systems   Review of Systems  Physical Exam Updated Vital Signs BP 113/60 (BP Location: Right Arm)   Pulse 78   Temp 97.7 F (36.5 C)   Resp 18   Ht 6' (1.829 m)   Wt 69.4 kg   SpO2 100%   BMI 20.75 kg/m   Physical Exam Vitals and nursing note reviewed.  Constitutional:      Appearance: He is well-developed.  HENT:     Head: Normocephalic and atraumatic.  Eyes:     Conjunctiva/sclera: Conjunctivae normal.  Cardiovascular:     Rate and Rhythm: Normal rate.     Heart sounds: No murmur heard. Pulmonary:     Effort: No respiratory distress.     Breath sounds: No wheezing, rhonchi or rales.  Chest:     Chest wall: No tenderness.  Musculoskeletal:     Cervical back: Normal range of motion and neck supple.  Skin:    General: Skin is warm and dry.  Neurological:     Mental Status: He is alert.     ED Results / Procedures /  Treatments   Labs (all labs ordered are listed, but only abnormal results are displayed) Labs Reviewed - No data to display  EKG None  Radiology No results found.  Procedures Procedures    Medications Ordered in ED Medications  ketorolac (TORADOL) 15 MG/ML injection 15 mg (has no administration in time range)    ED Course/ Medical Decision Making/ A&P    Patient seen and examined. History obtained directly from patient.   Labs/EKG: None ordered  Imaging: Ordered x-ray of the chest/ribs  Medications/Fluids: Ordered: IM Toradol.   Most recent vital signs reviewed and are as follows: BP 113/60 (BP Location: Right Arm)   Pulse 78   Temp 97.7 F (36.5 C)   Resp 18   Ht 6' (1.829 m)   Wt 69.4 kg   SpO2 100%   BMI 20.75 kg/m   Initial impression: Left-sided chest wall pain  11:06 PM Reassessment performed. Patient appears stable, comfortable.  Imaging personally visualized and interpreted including: X-ray of the ribs and chest, agree negative.  Reviewed pertinent lab work and imaging with patient at bedside. Questions answered.   Most current vital signs reviewed and are as follows: BP 113/60 (BP Location:  Right Arm)   Pulse 78   Temp 97.7 F (36.5 C)   Resp 18   Ht 6' (1.829 m)   Wt 69.4 kg   SpO2 100%   BMI 20.75 kg/m   Plan: Discharge to home.   Prescriptions written for: Meloxicam, methocarbamol  Patient counseled on proper use of muscle relaxant medication.  They were told not to drink alcohol, drive any vehicle, or do any dangerous activities while taking this medication.  Patient verbalized understanding.  Other home care instructions discussed: Rest  ED return instructions discussed: Worsening symptoms or other concerns  Follow-up instructions discussed: Patient encouraged to follow-up with their PCP in 1-2 weeks if not getting better.                                 Medical Decision Making Amount and/or Complexity of Data  Reviewed Radiology: ordered.  Risk Prescription drug management.   Patient with chest wall pain after bending ongoing for about 4 weeks.  X-rays negative today.  Pain is worse with movement and certain positions.  Patient continues to work through the symptoms.  Low concern for ACS, PE, dissection.  Will continue to treat conservatively with trial of muscle relaxers and prescription NSAIDs.        Final Clinical Impression(s) / ED Diagnoses Final diagnoses:  Chest wall pain    Rx / DC Orders ED Discharge Orders          Ordered    meloxicam (MOBIC) 7.5 MG tablet  Daily        07/02/22 2257    methocarbamol (ROBAXIN) 500 MG tablet  Every 8 hours PRN        07/02/22 2257              Carlisle Cater, PA-C 07/02/22 2307    Fredia Sorrow, MD 07/04/22 [phone]

## 2023-05-14 ENCOUNTER — Other Ambulatory Visit: Payer: Self-pay

## 2023-05-14 ENCOUNTER — Emergency Department (HOSPITAL_BASED_OUTPATIENT_CLINIC_OR_DEPARTMENT_OTHER)
Admission: EM | Admit: 2023-05-14 | Discharge: 2023-05-14 | Disposition: A | Discharge status: Home / Self Care | Payer: PRIVATE HEALTH INSURANCE

## 2023-05-14 ENCOUNTER — Encounter (HOSPITAL_BASED_OUTPATIENT_CLINIC_OR_DEPARTMENT_OTHER): Payer: Self-pay | Admitting: Emergency Medicine

## 2023-05-14 DIAGNOSIS — Z202 Contact with and (suspected) exposure to infections with a predominantly sexual mode of transmission: Secondary | ICD-10-CM | POA: Insufficient documentation

## 2023-05-14 LAB — URINALYSIS, MICROSCOPIC (REFLEX)

## 2023-05-14 LAB — URINALYSIS, ROUTINE W REFLEX MICROSCOPIC
Bilirubin Urine: NEGATIVE
Glucose, UA: NEGATIVE mg/dL
Hgb urine dipstick: NEGATIVE
Ketones, ur: NEGATIVE mg/dL
Nitrite: NEGATIVE
Protein, ur: NEGATIVE mg/dL
Specific Gravity, Urine: 1.025 (ref 1.005–1.030)
pH: 6.5 (ref 5.0–8.0)

## 2023-05-14 MED ORDER — DOXYCYCLINE HYCLATE 100 MG PO CAPS: 100.0000 mg | ORAL_CAPSULE | Freq: Two times a day (BID) | ORAL | 0 refills | Status: AC

## 2023-05-14 NOTE — Discharge Instructions (Signed)
As discussed, we will treat you empirically for STDs given your known exposure to chlamydia.  Please do not participate in sexual intercourse until you finish antibiotic course.  Ensure sexual partner does the same.  Please do not hesitate to return if the worrisome signs and symptoms we discussed become apparent.

## 2023-05-14 NOTE — ED Notes (Signed)

## 2023-05-14 NOTE — ED Triage Notes (Signed)
Pt here for STI check; no sxs or known exposure

## 2023-05-14 NOTE — ED Provider Notes (Signed)
[facility] [facility] Provider Note   CSN: [id number] Arrival date & time: 05/14/23  1431     History  Chief Complaint  Patient presents with   STI Check    Ian Mercado is a 32 y.o. male.  HPI   32 year old male presents emergency department with request of STD check.  Patient states that his sexual partner tested positive for chlamydia a day or 2 ago and is requesting testing as well as treatment.  States that the partner only tested positive chlamydia nothing else.  Denies any abdominal pain, fever, urinary symptoms, rash, testicular pain.  States that he feels at baseline.  No significant pertinent past medical history  Home Medications Prior to Admission medications   Medication Sig Start Date End Date Taking? Authorizing Provider  doxycycline (VIBRAMYCIN) 100 MG capsule Take 1 capsule (100 mg total) by mouth 2 (two) times daily. 05/14/23  Yes Sherian Maroon A, PA  meloxicam (MOBIC) 7.5 MG tablet Take 1 tablet (7.5 mg total) by mouth daily. 07/02/22   Renne Crigler, PA-C  methocarbamol (ROBAXIN) 500 MG tablet Take 2 tablets (1,000 mg total) by mouth every 8 (eight) hours as needed for muscle spasms. 07/02/22   Renne Crigler, PA-C      Allergies    Patient has no known allergies.    Review of Systems   Review of Systems  All other systems reviewed and are negative.   Physical Exam Updated Vital Signs BP 120/77 (BP Location: Right Arm)   Pulse 79   Temp 98.1 F (36.7 C) (Oral)   Resp 15   Ht 6' (1.829 m)   Wt 63.5 kg   SpO2 97%   BMI 18.99 kg/m  Physical Exam Vitals and nursing note reviewed.  Constitutional:      General: He is not in acute distress.    Appearance: He is well-developed.  HENT:     Head: Normocephalic and atraumatic.  Eyes:     Conjunctiva/sclera: Conjunctivae normal.  Cardiovascular:     Rate and Rhythm: Normal rate and regular rhythm.     Heart sounds: No murmur heard. Pulmonary:     Effort:  Pulmonary effort is normal. No respiratory distress.     Breath sounds: Normal breath sounds.  Abdominal:     Palpations: Abdomen is soft.     Tenderness: There is no abdominal tenderness.  Musculoskeletal:        General: No swelling.     Cervical back: Neck supple.  Skin:    General: Skin is warm and dry.     Capillary Refill: Capillary refill takes less than 2 seconds.  Neurological:     Mental Status: He is alert.  Psychiatric:        Mood and Affect: Mood normal.     ED Results / Procedures / Treatments   Labs (all labs ordered are listed, but only abnormal results are displayed) Labs Reviewed  URINALYSIS, ROUTINE W REFLEX MICROSCOPIC - Abnormal; Notable for the following components:      Result Value   Leukocytes,Ua TRACE (*)    All other components within normal limits  URINALYSIS, MICROSCOPIC (REFLEX) - Abnormal; Notable for the following components:   Bacteria, UA FEW (*)    All other components within normal limits  URINE CULTURE  GC/CHLAMYDIA PROBE AMP () NOT AT [facility]    EKG None  Radiology No results found.  Procedures Procedures    Medications Ordered in ED Medications - No  data to display  ED Course/ Medical Decision Making/ A&P                                 Medical Decision Making Amount and/or Complexity of Data Reviewed Labs: ordered.  Risk Prescription drug management.   This patient presents to the ED for concern of STD exposure, this involves an extensive number of treatment options, and is a complaint that carries with it a high risk of complications and morbidity.  The differential diagnosis includes chlamydia, other   Co morbidities that complicate the patient evaluation  See HPI   Additional history obtained:  Additional history obtained from EMR External records from outside source obtained and reviewed including hospital records   Lab Tests:  I Ordered, and personally interpreted labs.  The pertinent  results include: UA few bacteria, trace leukocytes, 6-10 WBCs and RBCs.  GC/chlamydia pending.   Imaging Studies ordered:  N/a   Cardiac Monitoring: / EKG:  The patient was maintained on a cardiac monitor.  I personally viewed and interpreted the cardiac monitored which showed an underlying rhythm of: sinus rhythm   Consultations Obtained:  N/a   Problem List / ED Course / Critical interventions / Medication management  STD exposure Reevaluation of the patient showed that the patient stayed the same I have reviewed the patients home medicines and have made adjustments as needed   Social Determinants of Health:  Tobacco use.  Denies illicit drug use.   Test / Admission - Considered:  STD exposure Vitals signs within normal range and stable throughout visit. Laboratory studies significant for: See above 32 year old male presents emergency department with complaints of STD exposure.  Male sexual partner tested positive for chlamydia.  Patient asymptomatic at this time.  UA as above.  GC/committee pending.  Will treat patient empirically given known chlamydial exposure.  Recommend following results on MyChart.  Routine follow-up with PCP recommended.  Treatment plan discussed at length with patient and he acknowledged understanding was agreeable to said plan.  Patient overall well-appearing, afebrile in no acute distress. Worrisome signs and symptoms were discussed with the patient, and the patient acknowledged understanding to return to the ED if noticed. Patient was stable upon discharge.          Final Clinical Impression(s) / ED Diagnoses Final diagnoses:  STD exposure    Rx / DC Orders ED Discharge Orders          Ordered    doxycycline (VIBRAMYCIN) 100 MG capsule  2 times daily        05/14/23 1508              Peter Garter, [place] 05/14/23 1600    Glyn Ade, MD 05/14/23 1753

## 2023-05-15 LAB — GC/CHLAMYDIA PROBE AMP (~~LOC~~) NOT AT ARMC
Chlamydia: POSITIVE — AB
Comment: NEGATIVE
Comment: NORMAL
Neisseria Gonorrhea: NEGATIVE

## 2023-05-16 LAB — URINE CULTURE: Culture: <10000 — AB
